# Patient Record
Sex: Female | Born: 2000 | Race: Black or African American | Hispanic: No | Marital: Single | Smoking: Never smoker
Health system: Southern US, Community
[De-identification: ages and names within clinical notes are randomized; demographics above are authoritative.]

## PROBLEM LIST (undated history)

## (undated) ENCOUNTER — Ambulatory Visit: Admission: EM | Payer: 59

## (undated) DIAGNOSIS — R7303 Prediabetes: Secondary | ICD-10-CM

## (undated) DIAGNOSIS — K429 Umbilical hernia without obstruction or gangrene: Secondary | ICD-10-CM

## (undated) DIAGNOSIS — E282 Polycystic ovarian syndrome: Secondary | ICD-10-CM

## (undated) HISTORY — PX: NO PAST SURGERIES: SHX2092

---
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
## 2021-01-21 ENCOUNTER — Other Ambulatory Visit: Payer: Self-pay

## 2021-01-21 MED ORDER — NORETHIN ACE-ETH ESTRAD-FE 1.5-30 MG-MCG PO TABS
ORAL_TABLET | ORAL | 1 refills | Status: DC
  Filled 2021-01-21 – 2021-02-07 (×2): qty 28, 28d supply, fill #0
  Filled 2021-03-01: qty 28, 28d supply, fill #1

## 2021-02-04 ENCOUNTER — Encounter: Payer: Self-pay | Admitting: Obstetrics and Gynecology

## 2021-02-04 ENCOUNTER — Other Ambulatory Visit: Payer: Self-pay

## 2021-02-04 ENCOUNTER — Ambulatory Visit (INDEPENDENT_AMBULATORY_CARE_PROVIDER_SITE_OTHER): Payer: No Typology Code available for payment source | Admitting: Obstetrics and Gynecology

## 2021-02-04 VITALS — BP 120/72 | Ht 65.0 in | Wt 271.6 lb

## 2021-02-04 DIAGNOSIS — N926 Irregular menstruation, unspecified: Secondary | ICD-10-CM

## 2021-02-04 DIAGNOSIS — N921 Excessive and frequent menstruation with irregular cycle: Secondary | ICD-10-CM

## 2021-02-04 NOTE — Progress Notes (Signed)
Patient ID: Brianna Gonzalez, female   DOB: [birth year], 20 y.o.   MRN: [id number]  Reason for Consult: Gynecologic Exam   Referred by Kaylyn Lim, NP  Subjective:     HPI:  Brianna Gonzalez is a 20 y.o. female  Gynecological History  No LMP recorded. Patient has had an implant.  Past Medical History:  Diagnosis Date   Pre-diabetes    Family History  Problem Relation Age of Onset   Healthy Mother    No past surgical history on file.  Short Social History:  Social History   Tobacco Use   Smoking status: Never   Smokeless tobacco: Never  Substance Use Topics   Alcohol use: Not Currently    No Known Allergies  Current Outpatient Medications  Medication Sig Dispense Refill   Amphetamine-Dextroamphetamine (ADDERALL PO) Take by mouth. (Patient not taking: Reported on 02/04/2021)     EPINEPHrine 0.3 mg/0.3 mL IJ SOAJ injection INJECT 0.3 MG INTO THE MUSCLE ONCE AS DIRECTED (Patient not taking: Reported on 02/04/2021) 2 each 2   famotidine (PEPCID) 20 MG tablet Take 1 tablet (20 mg total) by mouth 2 (two) times daily. (Patient not taking: Reported on 02/04/2021) 8 tablet 0   famotidine (PEPCID) 20 MG tablet TAKE 1 TABLET BY MOUTH TWICE DAILY (Patient not taking: Reported on 02/04/2021) 8 tablet 0   mupirocin ointment (BACTROBAN) 2 % Apply 1 application topically 2 (two) times daily. Apply under the nail for 7 days to prevent infection (Patient not taking: Reported on 02/04/2021) 22 g 0   norethindrone-ethinyl estradiol-iron (LOESTRIN FE) 1.5-30 MG-MCG tablet Take 1 tablet by mouth once a day for 28 days (Patient not taking: Reported on 02/04/2021) 28 tablet 1   predniSONE (DELTASONE) 20 MG tablet 3 tablets daily x 4 days (Patient not taking: Reported on 02/04/2021) 12 tablet 0   predniSONE (DELTASONE) 20 MG tablet TAKE 3 TABLETS (60MG ) BY MOUTH DAILY FOR 4 DAYS (Patient not taking: Reported on 02/04/2021) 12 tablet 0   Sertraline HCl (ZOLOFT PO) Take by mouth. (Patient not  taking: Reported on 02/04/2021)     No current facility-administered medications for this visit.    Review of Systems  Constitutional: Negative for chills, fatigue, fever and unexpected weight change.  HENT: Negative for trouble swallowing.  Eyes: Negative for loss of vision.  Respiratory: Negative for cough, shortness of breath and wheezing.  Cardiovascular: Negative for chest pain, leg swelling, palpitations and syncope.  GI: Negative for abdominal pain, blood in stool, diarrhea, nausea and vomiting.  GU: Negative for difficulty urinating, dysuria, frequency and hematuria.  Musculoskeletal: Negative for back pain, leg pain and joint pain.  Skin: Negative for rash.  Neurological: Negative for dizziness, headaches, light-headedness, numbness and seizures.  Psychiatric: Negative for behavioral problem, confusion, depressed mood and sleep disturbance.       Objective:  Objective   Vitals:   02/04/21 1557  BP: 120/72  Weight: 271 lb 9.6 oz (123.2 kg)  Height: 5\' 5"  (1.651 m)   Body mass index is 45.2 kg/m.  Physical Exam Vitals and nursing note reviewed. Exam conducted with a chaperone present.  Constitutional:      Appearance: Normal appearance.  HENT:     Head: Normocephalic and atraumatic.  Eyes:     Extraocular Movements: Extraocular movements intact.     Pupils: Pupils are equal, round, and reactive to light.  Cardiovascular:     Rate and Rhythm: Normal rate and regular rhythm.  Pulmonary:  Effort: Pulmonary effort is normal.     Breath sounds: Normal breath sounds.  Abdominal:     General: Abdomen is flat.     Palpations: Abdomen is soft.  Musculoskeletal:     Cervical back: Normal range of motion.  Skin:    General: Skin is warm and dry.  Neurological:     General: No focal deficit present.     Mental Status: She is alert and oriented to person, place, and time.  Psychiatric:        Behavior: Behavior normal.        Thought Content: Thought content  normal.        Judgment: Judgment normal.    Assessment/Plan:    20 yo with menorrhagia with irregular menstrual cycle Labs for PCOS.  Labs to check vitamin levels  More than 10 minutes were spent face to face with the patient in the room, reviewing the medical record, labs and images, and coordinating care for the patient. The plan of management was discussed in detail and counseling was provided.       Adrian Prows MD [facility] 02/04/2021 4:28 PM

## 2021-02-04 NOTE — Patient Instructions (Signed)
Iron-Rich Diet  Iron is a mineral that helps your body produce hemoglobin. Hemoglobin is a protein in red blood cells that carries oxygen to your body's tissues. Eating too little iron may cause you to feel weak and tired, and it can increase your risk of infection. Iron is naturally found in many foods, and many foods have iron added to them (are iron-fortified). You may need to follow an iron-rich diet if you do not have enough iron in your body due to certain medical conditions. The amount of iron that you need each day depends on your age, your sex, and any medical conditions you have. Follow instructions from your health care provider or a dietitian about how much ironyou should eat each day. What are tips for following this plan? Reading food labels Check food labels to see how many milligrams (mg) of iron are in each serving. Cooking Cook foods in pots and pans that are made from iron. Take these steps to make it easier for your body to absorb iron from certain foods: Soak beans overnight before cooking. Soak whole grains overnight and drain them before using. Ferment flours before baking, such as by using yeast in bread dough. Meal planning When you eat foods that contain iron, you should eat them with foods that are high in vitamin C. These include oranges, peppers, tomatoes, potatoes, and mangoes. Vitamin C helps your body absorb iron. Certain foods and drinks prevent your body from absorbing iron properly. Avoid eating these foods in the same meal as iron-rich foods or with iron supplements. These foods include: Coffee, black tea, and red wine. Milk, dairy products, and foods that are high in calcium. Beans and soybeans. Whole grains. General information Take iron supplements only as told by your health care provider. An overdose of iron can be life-threatening. If you were prescribed iron supplements, take them with orange juice or a vitamin C supplement. When you eat iron-fortified  foods or take an iron supplement, you should also eat foods that naturally contain iron, such as meat, poultry, and fish. Eating naturally iron-rich foods helps your body absorb the iron that is added to other foods or contained in a supplement. Iron from animal sources is better absorbed than iron from plant sources. What foods should I eat? Fruits Prunes. Raisins. Eat fruits high in vitamin C, such as oranges, grapefruits, and strawberries,with iron-rich foods. Vegetables Spinach (cooked). Green peas. Broccoli. Fermented vegetables. Eat vegetables high in vitamin C, such as leafy greens, potatoes, bell peppers,and tomatoes, with iron-rich foods. Grains Iron-fortified breakfast cereal. Iron-fortified whole-wheat bread. Enrichedrice. Sprouted grains. Meats and other proteins Beef liver. Beef. [place]. Chicken. Oysters. Shrimp. [place]. Sardines. Chickpeas.Nuts. Tofu. Pumpkin seeds. Beverages Tomato juice. Fresh orange juice. Prune juice. Hibiscus tea. Iron-fortifiedinstant breakfast shakes. Sweets and desserts Blackstrap molasses. Seasonings and condiments Tahini. Fermented soy sauce. Other foods Wheat germ. The items listed above may not be a complete list of recommended foods and beverages. Contact a dietitian for more information. What foods should I limit? These are foods that should be limited while eating iron-rich foods as they canreduce the absorption of iron in your body. Grains Whole grains. Bran cereal. Bran flour. Meats and other proteins Soybeans. Products made from soy protein. Black beans. Lentils. Mung beans.Split peas. Dairy Milk. Cream. Cheese. Yogurt. Cottage cheese. Beverages Coffee. Black tea. Red wine. Sweets and desserts Cocoa. Chocolate. Ice cream. Seasonings and condiments Basil. Oregano. Large amounts of parsley. The items listed above may not be a complete list of  foods and beverages you should limit. Contact a dietitian for more information. Summary Iron  is a mineral that helps your body produce hemoglobin. Hemoglobin is a protein in red blood cells that carries oxygen to your body's tissues. Iron is naturally found in many foods, and many foods have iron added to them (are iron-fortified). When you eat foods that contain iron, you should eat them with foods that are high in vitamin C. Vitamin C helps your body absorb iron. Certain foods and drinks prevent your body from absorbing iron properly, such as whole grains and dairy products. You should avoid eating these foods in the same meal as iron-rich foods or with iron supplements. This information is not intended to replace advice given to you by your health care provider. Make sure you discuss any questions you have with your healthcare provider. Document Revised: 07/16/2020 Document Reviewed: 07/16/2020 Elsevier Patient Education  2022 [employer].

## 2021-02-05 ENCOUNTER — Other Ambulatory Visit: Payer: No Typology Code available for payment source

## 2021-02-05 DIAGNOSIS — N921 Excessive and frequent menstruation with irregular cycle: Secondary | ICD-10-CM

## 2021-02-05 DIAGNOSIS — N926 Irregular menstruation, unspecified: Secondary | ICD-10-CM

## 2021-02-07 ENCOUNTER — Other Ambulatory Visit: Payer: Self-pay

## 2021-02-10 LAB — CBC WITH DIFFERENTIAL
Basophils Absolute: 0 10*3/uL (ref 0.0–0.2)
Basos: 1 %
EOS (ABSOLUTE): 0.2 10*3/uL (ref 0.0–0.4)
Eos: 5 %
Hematocrit: 36.1 % (ref 34.0–46.6)
Hemoglobin: 11.5 g/dL (ref 11.1–15.9)
Immature Grans (Abs): 0 10*3/uL (ref 0.0–0.1)
Immature Granulocytes: 0 %
Lymphocytes Absolute: 1.8 10*3/uL (ref 0.7–3.1)
Lymphs: 37 %
MCH: 26.4 pg — ABNORMAL LOW (ref 26.6–33.0)
MCHC: 31.9 g/dL (ref 31.5–35.7)
MCV: 83 fL (ref 79–97)
Monocytes Absolute: 0.3 10*3/uL (ref 0.1–0.9)
Monocytes: 6 %
Neutrophils Absolute: 2.5 10*3/uL (ref 1.4–7.0)
Neutrophils: 51 %
RBC: 4.35 x10E6/uL (ref 3.77–5.28)
RDW: 13.3 % (ref 11.7–15.4)
WBC: 4.9 10*3/uL (ref 3.4–10.8)

## 2021-02-10 LAB — IRON AND TIBC
Iron Saturation: 9 % — CL (ref 15–55)
Iron: 27 ug/dL (ref 27–159)
Total Iron Binding Capacity: 303 ug/dL (ref 250–450)
UIBC: 276 ug/dL (ref 131–425)

## 2021-02-10 LAB — FERRITIN: Ferritin: 52 ng/mL (ref 15–77)

## 2021-02-10 LAB — TSH+PRL+FSH+TESTT+LH+DHEA S...
17-Hydroxyprogesterone: 19 ng/dL
Androstenedione: 80 ng/dL (ref 41–262)
DHEA-SO4: 132 ug/dL (ref 110.0–433.2)
FSH: 6.8 m[IU]/mL
LH: 8.3 m[IU]/mL
Prolactin: 12.8 ng/mL (ref 4.8–23.3)
TSH: 1.01 u[IU]/mL (ref 0.450–4.500)
Testosterone, Free: 1.3 pg/mL
Testosterone: 14 ng/dL (ref 13–71)

## 2021-02-10 LAB — HEMOGLOBIN A1C
Est. average glucose Bld gHb Est-mCnc: 117 mg/dL
Hgb A1c MFr Bld: 5.7 % — ABNORMAL HIGH (ref 4.8–5.6)

## 2021-02-10 LAB — VITAMIN B12: Vitamin B-12: 192 pg/mL — ABNORMAL LOW (ref 232–1245)

## 2021-02-19 ENCOUNTER — Other Ambulatory Visit: Payer: Self-pay

## 2021-02-19 MED ORDER — CETIRIZINE HCL 10 MG PO TABS: ORAL_TABLET | ORAL | 10 refills | Status: DC

## 2021-03-01 ENCOUNTER — Other Ambulatory Visit: Payer: Self-pay

## 2021-04-01 ENCOUNTER — Ambulatory Visit (INDEPENDENT_AMBULATORY_CARE_PROVIDER_SITE_OTHER): Payer: No Typology Code available for payment source | Admitting: Obstetrics and Gynecology

## 2021-04-01 ENCOUNTER — Other Ambulatory Visit: Payer: Self-pay

## 2021-04-01 ENCOUNTER — Encounter: Payer: Self-pay | Admitting: Obstetrics and Gynecology

## 2021-04-01 VITALS — BP 118/68 | Ht 65.0 in | Wt 269.4 lb

## 2021-04-01 DIAGNOSIS — E538 Deficiency of other specified B group vitamins: Secondary | ICD-10-CM

## 2021-04-01 DIAGNOSIS — N921 Excessive and frequent menstruation with irregular cycle: Secondary | ICD-10-CM | POA: Diagnosis not present

## 2021-04-01 MED ORDER — VITAMIN B-12 1000 MCG PO TABS: 1000.0000 ug | ORAL_TABLET | Freq: Every day | ORAL | 11 refills | Status: DC

## 2021-04-01 MED ORDER — NORETHIN ACE-ETH ESTRAD-FE 1.5-30 MG-MCG PO TABS: ORAL_TABLET | ORAL | 11 refills | Status: DC

## 2021-04-01 NOTE — Patient Instructions (Addendum)
B12- 1000 to 2000 mcg daily   Prediabetes Prediabetes is when your blood sugar (blood glucose) level is higher than normal but not high enough for you to be diagnosed with type 2 diabetes. Having prediabetes puts you at risk for developing type 2 diabetes (type 2 diabetes mellitus). With certain lifestyle changes, you may be able to prevent or delay the onset of type 2 diabetes. This is important because type 2 diabetes can lead to serious complications, such as: Heart disease. Stroke. Blindness. Kidney disease. Depression. Poor circulation in the feet and legs. In severe cases, this could lead to surgical removal of a leg (amputation). What are the causes? The exact cause of prediabetes is not known. It may result from insulin resistance. Insulin resistance develops when cells in the body do not respond properly to insulin that the body makes. This can cause excess glucose to build up in the blood. High blood glucose (hyperglycemia) can develop. What increases the risk? The following factors may make you more likely to develop this condition: You have a family member with type 2 diabetes. You are older than 45 years. You had a temporary form of diabetes during a pregnancy (gestational diabetes). You had polycystic ovary syndrome (PCOS). You are overweight or obese. You are inactive (sedentary). You have a history of heart disease, including problems with cholesterol levels, high levels of blood fats, or high blood pressure. What are the signs or symptoms? You may have no symptoms. If you do have symptoms, they may include: Increased hunger. Increased thirst. Increased urination. Vision changes, such as blurry vision. Tiredness (fatigue). How is this diagnosed? This condition can be diagnosed with blood tests. Your blood glucose may be checked with one or more of the following tests: A fasting blood glucose (FBG) test. You will not be allowed to eat (you will fast) for at least 8 hours  before a blood sample is taken. An A1C blood test (hemoglobin A1C). This test provides information about blood glucose levels over the previous 2?3 months. An oral glucose tolerance test (OGTT). This test measures your blood glucose at two points in time: After fasting. This is your baseline level. Two hours after you drink a beverage that contains glucose. You may be diagnosed with prediabetes if: Your FBG is 100?125 mg/dL (5.6-6.9 mmol/L). Your A1C level is 5.7?6.4% (39-46 mmol/mol). Your OGTT result is 140?199 mg/dL (7.8-11 mmol/L). These blood tests may be repeated to confirm your diagnosis. How is this treated? Treatment may include dietary and lifestyle changes to help lower your blood glucose and prevent type 2 diabetes from developing. In some cases, medicinemay be prescribed to help lower the risk of type 2 diabetes. Follow these instructions at home: Nutrition  Follow a healthy meal plan. This includes eating lean proteins, whole grains, legumes, fresh fruits and vegetables, low-fat dairy products, and healthy fats. Follow instructions from your health care provider about eating or drinking restrictions. Meet with a dietitian to create a healthy eating plan that is right for you.  Lifestyle Do moderate-intensity exercise for at least 30 minutes a day on 5 or more days each week, or as told by your health care provider. A mix of activities may be best, such as: Brisk walking, swimming, biking, and weight lifting. Lose weight as told by your health care provider. Losing 5-7% of your body weight can reverse insulin resistance. Do not drink alcohol if: Your health care provider tells you not to drink. You are pregnant, may be pregnant, or are  planning to become pregnant. If you drink alcohol: Limit how much you use to: 0-1 drink a day for women. 0-2 drinks a day for men. Be aware of how much alcohol is in your drink. In the [place] one drink equals one 12 oz bottle of beer (355 mL),  one 5 oz glass of wine (148 mL), or one 1 oz glass of hard liquor (44 mL). General instructions Take over-the-counter and prescription medicines only as told by your health care provider. You may be prescribed medicines that help lower the risk of type 2 diabetes. Do not use any products that contain nicotine or tobacco, such as cigarettes, e-cigarettes, and chewing tobacco. If you need help quitting, ask your health care provider. Keep all follow-up visits. This is important. Where to find more information American Diabetes Association: www.diabetes.org Academy of Nutrition and Dietetics: www.eatright.org American Heart Association: www.heart.org Contact a health care provider if: You have any of these symptoms: Increased hunger. Increased urination. Increased thirst. Fatigue. Vision changes, such as blurry vision. Get help right away if you: Have shortness of breath. Feel confused. Vomit or feel like you may vomit. Summary Prediabetes is when your blood sugar (blood glucose)level is higher than normal but not high enough for you to be diagnosed with type 2 diabetes. Having prediabetes puts you at risk for developing type 2 diabetes (type 2 diabetes mellitus). Make lifestyle changes such as eating a healthy diet and exercising regularly to help prevent diabetes. Lose weight as told by your health care provider. This information is not intended to replace advice given to you by your health care provider. Make sure you discuss any questions you have with your healthcare provider. Document Revised: 11/03/2019 Document Reviewed: 11/03/2019 Elsevier Patient Education  [place].    Vitamin B12 Deficiency Vitamin B12 deficiency occurs when the body does not have enough vitamin B12, which is an important vitamin. The body needs this vitamin: To make red blood cells. To make DNA. This is the genetic material inside cells. To help the nerves work properly so they can carry  messages from the brain to the body. Vitamin B12 deficiency can cause various health problems, such as a low red blood cell count (anemia) or nerve damage. What are the causes? This condition may be caused by: Not eating enough foods that contain vitamin B12. Not having enough stomach acid and digestive fluids to properly absorb vitamin B12 from the food that you eat. Certain digestive system diseases that make it hard to absorb vitamin B12. These diseases include Crohn's disease, chronic pancreatitis, and cystic fibrosis. A condition in which the body does not make enough of a protein (intrinsic factor), resulting in too few red blood cells (pernicious anemia). Having a surgery in which part of the stomach or small intestine is removed. Taking certain medicines that make it hard for the body to absorb vitamin B12. These medicines include: Heartburn medicines (antacids and proton pump inhibitors). Certain antibiotic medicines. Some medicines that are used to treat diabetes, tuberculosis, gout, or high cholesterol. What increases the risk? The following factors may make you more likely to develop a B12 deficiency: Being older than age 52. Eating a vegetarian or vegan diet, especially while you are pregnant. Eating a poor diet while you are pregnant. Taking certain medicines. Having alcoholism. What are the signs or symptoms? In some cases, there are no symptoms of this condition. If the condition leads to anemia or nerve damage, various symptoms can occur, such as: Weakness. Fatigue.  Loss of appetite. Weight loss. Numbness or tingling in your hands and feet. Redness and burning of the tongue. Confusion or memory problems. Depression. Sensory problems, such as color blindness, ringing in the ears, or loss of taste. Diarrhea or constipation. Trouble walking. If anemia is severe, symptoms can include: Shortness of breath. Dizziness. Rapid heart rate (tachycardia). How is this  diagnosed? This condition may be diagnosed with a blood test to measure the level of vitamin B12 in your blood. You may also have other tests, including: A group of tests that measure certain characteristics of blood cells (complete blood count, CBC). A blood test to measure intrinsic factor. A procedure where a thin tube with a camera on the end is used to look into your stomach or intestines (endoscopy). Other tests may be needed to discover the cause of B12 deficiency. How is this treated? Treatment for this condition depends on the cause. This condition may be treated by: Changing your eating and drinking habits, such as: Eating more foods that contain vitamin B12. Drinking less alcohol or no alcohol. Getting vitamin B12 injections. Taking vitamin B12 supplements. Your health care provider will tell you which dosage is best for you. Follow these instructions at home: Eating and drinking  Eat lots of healthy foods that contain vitamin B12, including: Meats and poultry. This includes beef, pork, chicken, [place], and organ meats, such as liver. Seafood. This includes clams, rainbow trout, salmon, tuna, and haddock. Eggs. Cereal and dairy products that are fortified. This means that vitamin B12 has been added to the food. Check the label on the package to see if the food is fortified. The items listed above may not be a complete list of recommended foods and beverages. Contact a dietitian for more information. General instructions Get any injections that are prescribed by your health care provider. Take supplements only as told by your health care provider. Follow the directions carefully. Do not drink alcohol if your health care provider tells you not to. In some cases, you may only be asked to limit alcohol use. Keep all follow-up visits as told by your health care provider. This is important. Contact a health care provider if: Your symptoms come back. Get help right away if  you: Develop shortness of breath. Have a rapid heart rate. Have chest pain. Become dizzy or lose consciousness. Summary Vitamin B12 deficiency occurs when the body does not have enough vitamin B12. The main causes of vitamin B12 deficiency include dietary deficiency, digestive diseases, pernicious anemia, and having a surgery in which part of the stomach or small intestine is removed. In some cases, there are no symptoms of this condition. If the condition leads to anemia or nerve damage, various symptoms can occur, such as weakness, shortness of breath, and numbness. Treatment may include getting vitamin B12 injections or taking vitamin B12 supplements. Eat lots of healthy foods that contain vitamin B12. This information is not intended to replace advice given to you by your health care provider. Make sure you discuss any questions you have with your healthcare provider. Document Revised: 01/21/2019 Document Reviewed: 04/13/2018 Elsevier Patient Education  2022 [employer].

## 2021-04-01 NOTE — Progress Notes (Signed)
Patient ID: Brianna Gonzalez, female   DOB: November 19, 2000, 20 y.o.   MRN: [id number]  Reason for Consult: Gynecologic Exam   Referred by No ref. provider found  Subjective:     HPI:  Brianna Gonzalez is a 20 y.o. female.  She is following up today regarding irregular bleeding with Nexplanon.  She reports that she took her birth control pill for several months and saw an improvement in her bleeding pattern.  She would like to continue taking this hormonal birth control pill.  She reports that she had a period when she discontinued the medication on 03/27/2021 and today is having a small light spotting.  She reports that her Nexplanon is due to be exchanged in 2023.  She is uncertain regarding the date of insertion of the Nexplanon.  She underwent laboratory testing.  No significant elevations in thyroid or prolactin levels were noted.  Elevated LH to [facility] ratio was noted.  She underwent laboratories screening for anemia.  B12 deficiency was noted.  Iron levels were normal.  Her laboratory testing showed that she was prediabetic.  She reports that she has not previously been told that she has prediabetes.  Gynecological History  Patient's last menstrual period was 03/27/2021.  Past Medical History:  Diagnosis Date   Pre-diabetes    Family History  Problem Relation Age of Onset   Healthy Mother    History reviewed. No pertinent surgical history.  Short Social History:  Social History   Tobacco Use   Smoking status: Never   Smokeless tobacco: Never  Substance Use Topics   Alcohol use: Not Currently    No Known Allergies  Current Outpatient Medications  Medication Sig Dispense Refill   Amphetamine-Dextroamphetamine (ADDERALL PO) Take by mouth. (Patient not taking: Reported on 02/04/2021)     cetirizine (ZYRTEC ALLERGY) 10 MG tablet take 1 pill a day 100 tablet 10   EPINEPHrine 0.3 mg/0.3 mL IJ SOAJ injection INJECT 0.3 MG INTO THE MUSCLE ONCE AS DIRECTED (Patient not  taking: Reported on 02/04/2021) 2 each 2   famotidine (PEPCID) 20 MG tablet Take 1 tablet (20 mg total) by mouth 2 (two) times daily. (Patient not taking: Reported on 02/04/2021) 8 tablet 0   famotidine (PEPCID) 20 MG tablet TAKE 1 TABLET BY MOUTH TWICE DAILY (Patient not taking: Reported on 02/04/2021) 8 tablet 0   mupirocin ointment (BACTROBAN) 2 % Apply 1 application topically 2 (two) times daily. Apply under the nail for 7 days to prevent infection (Patient not taking: Reported on 02/04/2021) 22 g 0   norethindrone-ethinyl estradiol-iron (LOESTRIN FE) 1.5-30 MG-MCG tablet Take 1 tablet by mouth once a day for 28 days 28 tablet 11   predniSONE (DELTASONE) 20 MG tablet 3 tablets daily x 4 days (Patient not taking: Reported on 02/04/2021) 12 tablet 0   predniSONE (DELTASONE) 20 MG tablet TAKE 3 TABLETS ('60MG'$ ) BY MOUTH DAILY FOR 4 DAYS (Patient not taking: Reported on 02/04/2021) 12 tablet 0   Sertraline HCl (ZOLOFT PO) Take by mouth. (Patient not taking: Reported on 02/04/2021)     No current facility-administered medications for this visit.    Review of Systems  Constitutional: Negative for chills, fatigue, fever and unexpected weight change.  HENT: Negative for trouble swallowing.  Eyes: Negative for loss of vision.  Respiratory: Negative for cough, shortness of breath and wheezing.  Cardiovascular: Negative for chest pain, leg swelling, palpitations and syncope.  GI: Negative for abdominal pain, blood in stool, diarrhea, nausea and vomiting.  GU: Negative for difficulty urinating, dysuria, frequency and hematuria.  Musculoskeletal: Negative for back pain, leg pain and joint pain.  Skin: Negative for rash.  Neurological: Negative for dizziness, headaches, light-headedness, numbness and seizures.  Psychiatric: Negative for behavioral problem, confusion, depressed mood and sleep disturbance.       Objective:  Objective   Vitals:   04/01/21 0955  BP: 118/68  Weight: 269 lb 6.4 oz (122.2 kg)   Height: '5\' 5"'$  (1.651 m)   Body mass index is 44.83 kg/m.  Physical Exam Vitals and nursing note reviewed. Exam conducted with a chaperone present.  Constitutional:      Appearance: Normal appearance.  HENT:     Head: Normocephalic and atraumatic.  Eyes:     Extraocular Movements: Extraocular movements intact.     Pupils: Pupils are equal, round, and reactive to light.  Cardiovascular:     Rate and Rhythm: Normal rate and regular rhythm.  Pulmonary:     Effort: Pulmonary effort is normal.     Breath sounds: Normal breath sounds.  Abdominal:     General: Abdomen is flat.     Palpations: Abdomen is soft.  Musculoskeletal:     Cervical back: Normal range of motion.  Skin:    General: Skin is warm and dry.  Neurological:     General: No focal deficit present.     Mental Status: She is alert and oriented to person, place, and time.  Psychiatric:        Behavior: Behavior normal.        Thought Content: Thought content normal.        Judgment: Judgment normal.    Assessment/Plan:     20 year old with menorrhagia and irregular menstrual cycle bleeding. She would like to continue the hormonal birth control pill for management of her irregular menstrual cycle.  Prescription was sent.  Discussed the possibility of PCOS and provide the patient with information regarding PCOS. B12 deficiency-recommended supplementation with B12 and sent prescription to the pharmacy for the patient.  Recommended repeating labs in 6 months to assess for improvement. Prediabetic.  Discussed implementation of lifestyle changes.  Discussed options for metformin therapy.  Provide the patient with information.  She would like to discuss the possibility of starting metformin with her aunt.  Will plan to follow-up with the patient in 6 months.  More than 20 minutes were spent face to face with the patient in the room, reviewing the medical record, labs and images, and coordinating care for the patient. The  plan of management was discussed in detail and counseling was provided.      Adrian Prows MD [facility] 04/01/2021 10:15 AM

## 2021-04-03 ENCOUNTER — Other Ambulatory Visit: Payer: Self-pay

## 2021-04-03 ENCOUNTER — Telehealth: Payer: Self-pay

## 2021-04-03 DIAGNOSIS — N921 Excessive and frequent menstruation with irregular cycle: Secondary | ICD-10-CM

## 2021-04-03 MED ORDER — NORETHIN ACE-ETH ESTRAD-FE 1.5-30 MG-MCG PO TABS
ORAL_TABLET | ORAL | 11 refills | Status: DC
  Filled 2021-04-03: qty 28, 28d supply, fill #0

## 2021-04-03 NOTE — Telephone Encounter (Signed)
Pt was seen on Mon and rx of bc was sent to [facility]; pt actually needs rx sent to [facility].  [phone]  Pt aware by [facility] pharm changed and rx sent to them.

## 2021-04-04 ENCOUNTER — Other Ambulatory Visit: Payer: Self-pay

## 2021-04-08 ENCOUNTER — Other Ambulatory Visit: Payer: Self-pay | Admitting: Obstetrics and Gynecology

## 2021-04-08 ENCOUNTER — Other Ambulatory Visit: Payer: Self-pay

## 2021-04-08 DIAGNOSIS — N921 Excessive and frequent menstruation with irregular cycle: Secondary | ICD-10-CM

## 2021-04-08 MED ORDER — NORETHIN ACE-ETH ESTRAD-FE 1.5-30 MG-MCG PO TABS
ORAL_TABLET | ORAL | 11 refills | Status: DC
  Filled 2021-04-08: qty 28, fill #0

## 2021-04-17 ENCOUNTER — Other Ambulatory Visit: Payer: Self-pay

## 2021-04-17 ENCOUNTER — Ambulatory Visit
Admission: EM | Admit: 2021-04-17 | Discharge: 2021-04-17 | Disposition: A | Discharge status: Home / Self Care | Payer: No Typology Code available for payment source | Attending: Sports Medicine | Admitting: Sports Medicine

## 2021-04-17 DIAGNOSIS — S8011XA Contusion of right lower leg, initial encounter: Secondary | ICD-10-CM
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
## 2021-05-13 ENCOUNTER — Other Ambulatory Visit: Payer: Self-pay

## 2021-08-12 IMAGING — CR DG TOE 5TH 2+V*R*
3 series · 3 of 3 positions shown · non-contrast
Comparison: None.

CLINICAL DATA: Right small toe contusion, crushing injury

EXAM:
RIGHT FIFTH TOE

[toe ap]
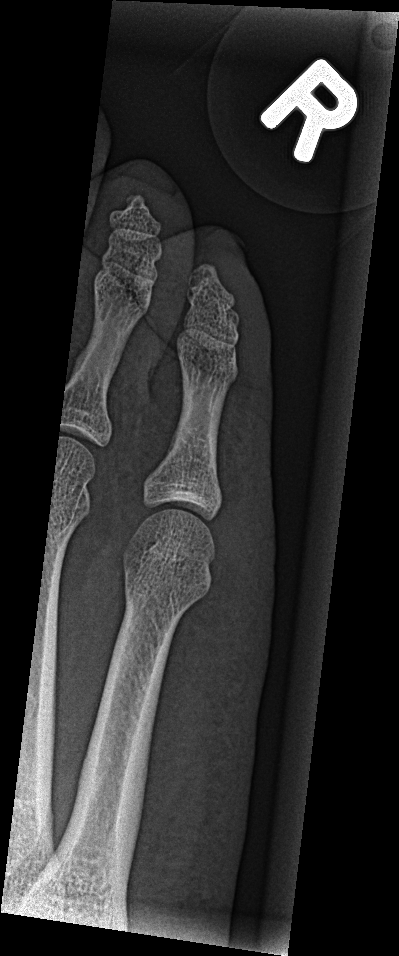

[toe obl]
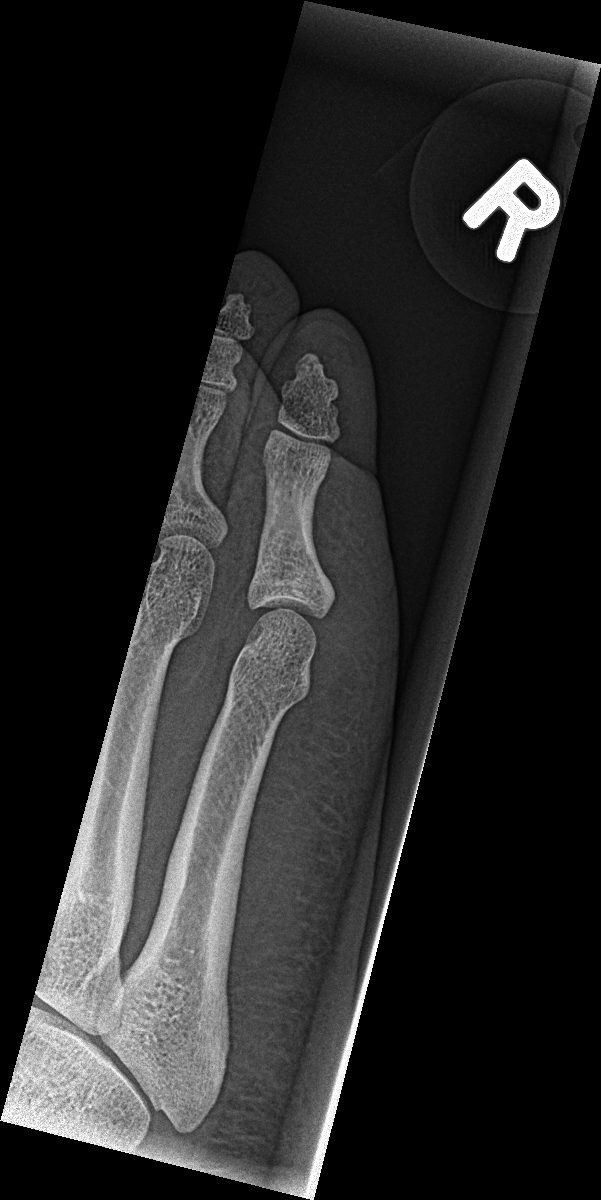

[toe lat]
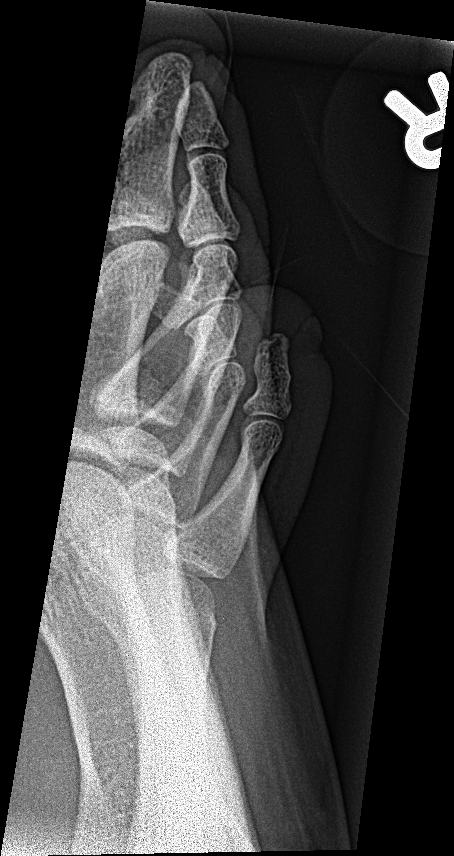

[3 of 3 positions shown; findings below may reference images not displayed]

FINDINGS: There is no evidence of fracture or dislocation. There is no
evidence of arthropathy or other focal bone abnormality. Soft tissue
edema.
IMPRESSION: No fracture or dislocation of the right fifth toe. Soft tissue
edema.

## 2021-10-02 ENCOUNTER — Ambulatory Visit: Payer: No Typology Code available for payment source | Admitting: Obstetrics and Gynecology

## 2021-10-02 NOTE — Progress Notes (Signed)
Patient, No Pcp Per (Inactive)   Chief Complaint  Patient presents with   Follow-up    From last visit with CRS, BC sent did help with bleeding. Stopped BC middle/end of Oct and bleeding is heavy and severe cramping is back    HPI:      [name] is a 21 y.o. No obstetric history on file. whose LMP was Patient's last menstrual period was 09/27/2021 (exact date)., presents today for heavy bleeding with nexplanon. Nexplanon placed ~04/2019. Was started on OCPs 8/22 with Dr. Gilman Schmidt for BTB/menorrhagia with nexplanon. Pt had monthly periods on pills, lasting 3-4 days, mod flow, no BTB, no dysmen. Pt stopped OCPs 10/22 and has been bleeding daily since. Flow is heavy with clots, changing large pads several times an hour, also with dysmen, somewhat improved with ibup/tylenol. Would like to go back on OCPs due to bleeding. Had normal thyroid/prolactin labs 6/22. Pt is not sex active; had neg STD testing 2 wks ago.   There are no problems to display for this patient.   Past Surgical History:  Procedure Laterality Date   NO PAST SURGERIES      Family History  Problem Relation Age of Onset   Healthy Mother     Social History   Socioeconomic History   Marital status: Single    Spouse name: Not on file   Number of children: Not on file   Years of education: Not on file   Highest education level: Not on file  Occupational History   Not on file  Tobacco Use   Smoking status: Never   Smokeless tobacco: Never  Vaping Use   Vaping Use: Never used  Substance and Sexual Activity   Alcohol use: Not Currently   Drug use: Not Currently   Sexual activity: Not Currently    Birth control/protection: None  Other Topics Concern   Not on file  Social History Narrative   ** Merged History Encounter **       Social Determinants of Health   Financial Resource Strain: Not on file  Food Insecurity: Not on file  Transportation Needs: Not on file  Physical Activity: Not on  file  Stress: Not on file  Social Connections: Not on file  Intimate Partner Violence: Not on file    Outpatient Medications Prior to Visit  Medication Sig Dispense Refill   EPINEPHrine 0.3 mg/0.3 mL IJ SOAJ injection INJECT 0.3 MG INTO THE MUSCLE ONCE AS DIRECTED 2 each 2   Amphetamine-Dextroamphetamine (ADDERALL PO) Take by mouth. (Patient not taking: No sig reported)     cetirizine (ZYRTEC ALLERGY) 10 MG tablet take 1 pill a day 100 tablet 10   famotidine (PEPCID) 20 MG tablet Take 1 tablet (20 mg total) by mouth 2 (two) times daily. (Patient not taking: No sig reported) 8 tablet 0   famotidine (PEPCID) 20 MG tablet TAKE 1 TABLET BY MOUTH TWICE DAILY (Patient not taking: No sig reported) 8 tablet 0   mupirocin ointment (BACTROBAN) 2 % Apply 1 application topically 2 (two) times daily. Apply under the nail for 7 days to prevent infection (Patient not taking: No sig reported) 22 g 0   norethindrone-ethinyl estradiol-iron (LOESTRIN FE) 1.5-30 MG-MCG tablet Take 1 tablet by mouth once a day for 28 days (Patient not taking: Reported on 10/03/2021) 28 tablet 11   predniSONE (DELTASONE) 20 MG tablet 3 tablets daily x 4 days (Patient not taking: No sig reported) 12 tablet 0   predniSONE (  DELTASONE) 20 MG tablet TAKE 3 TABLETS (60MG ) BY MOUTH DAILY FOR 4 DAYS (Patient not taking: No sig reported) 12 tablet 0   Sertraline HCl (ZOLOFT PO) Take by mouth. (Patient not taking: No sig reported)     vitamin B-12 (CYANOCOBALAMIN) 1000 MCG tablet Take 1 tablet (1,000 mcg total) by mouth daily. 30 tablet 11   No facility-administered medications prior to visit.      ROS:  Review of Systems  Constitutional:  Negative for fever.  Gastrointestinal:  Negative for blood in stool, constipation, diarrhea, nausea and vomiting.  Genitourinary:  Positive for menstrual problem. Negative for dyspareunia, dysuria, flank pain, frequency, hematuria, urgency, vaginal bleeding, vaginal discharge and vaginal pain.   Musculoskeletal:  Negative for back pain.  Skin:  Negative for rash.  BREAST: No symptoms   OBJECTIVE:   Vitals:  BP 108/70    Ht 5\' 3"  (1.6 m)    Wt 273 lb (123.8 kg)    LMP 09/27/2021 (Exact Date)    BMI 48.36 kg/m   Physical Exam Vitals reviewed.  Constitutional:      Appearance: She is well-developed.  Pulmonary:     Effort: Pulmonary effort is normal.  Musculoskeletal:        General: Normal range of motion.     Cervical back: Normal range of motion.  Skin:    General: Skin is warm and dry.  Neurological:     General: No focal deficit present.     Mental Status: She is alert and oriented to person, place, and time.     Cranial Nerves: No cranial nerve deficit.  Psychiatric:        Mood and Affect: Mood normal.        Behavior: Behavior normal.        Thought Content: Thought content normal.        Judgment: Judgment normal.    Assessment/Plan: Breakthrough bleeding on Nexplanon - Plan: norethindrone-ethinyl estradiol-iron (LOESTRIN FE) 1.5-30 MG-MCG tablet; restart OCPs today. Rx RF eRxd till 9/23. Discussed removal of nexplanon 9/23 or earlier. Pt would like to RTO sooner for removal and will just continue OCPs. Not sex active, neg STD testing 2 wks ago. F/u for further eval if sx don't resolve with OCPs.  Menorrhagia with irregular cycle - Plan: norethindrone-ethinyl estradiol-iron (LOESTRIN FE) 1.5-30 MG-MCG tablet    Meds ordered this encounter  Medications   norethindrone-ethinyl estradiol-iron (LOESTRIN FE) 1.5-30 MG-MCG tablet    Sig: Take 1 tablet by mouth once a day for 28 days    Dispense:  84 tablet    Refill:  2    Order Specific Question:   Supervising Provider    Answer:   Gae Dry [[id number]]      Return if symptoms worsen or fail to improve.  Georgie Haque B. Jude Linck, PA-C 10/03/2021 11:21 AM

## 2021-10-03 ENCOUNTER — Encounter: Payer: Self-pay | Admitting: Obstetrics and Gynecology

## 2021-10-03 ENCOUNTER — Other Ambulatory Visit: Payer: Self-pay

## 2021-10-03 ENCOUNTER — Ambulatory Visit (INDEPENDENT_AMBULATORY_CARE_PROVIDER_SITE_OTHER): Payer: No Typology Code available for payment source | Admitting: Obstetrics and Gynecology

## 2021-10-03 VITALS — BP 108/70 | Ht 63.0 in | Wt 273.0 lb

## 2021-10-03 DIAGNOSIS — Z975 Presence of (intrauterine) contraceptive device: Secondary | ICD-10-CM

## 2021-10-03 DIAGNOSIS — N921 Excessive and frequent menstruation with irregular cycle: Secondary | ICD-10-CM

## 2021-10-03 MED ORDER — NORETHIN ACE-ETH ESTRAD-FE 1.5-30 MG-MCG PO TABS
ORAL_TABLET | ORAL | 2 refills | Status: DC
  Filled 2021-10-03 – 2021-10-18 (×2): qty 84, 84d supply, fill #0

## 2021-10-03 NOTE — Patient Instructions (Signed)
I value your feedback and you entrusting us with your care. If you get a [facility] patient survey, I would appreciate you taking the time to let us know about your experience today. Thank you! ? ? ?

## 2021-10-14 ENCOUNTER — Other Ambulatory Visit: Payer: Self-pay

## 2021-10-18 ENCOUNTER — Other Ambulatory Visit: Payer: Self-pay

## 2022-06-09 ENCOUNTER — Other Ambulatory Visit: Payer: Self-pay

## 2022-06-09 ENCOUNTER — Emergency Department
Admission: EM | Admit: 2022-06-09 | Discharge: 2022-06-10 | Disposition: A | Discharge status: Home / Self Care | Payer: No Typology Code available for payment source | Attending: Emergency Medicine | Admitting: Emergency Medicine

## 2022-06-09 ENCOUNTER — Encounter: Payer: Self-pay | Admitting: Emergency Medicine

## 2022-06-09 DIAGNOSIS — N8312 Corpus luteum cyst of left ovary: Secondary | ICD-10-CM | POA: Diagnosis not present

## 2022-06-09 DIAGNOSIS — R102 Pelvic and perineal pain: Secondary | ICD-10-CM | POA: Diagnosis present

## 2022-06-09 DIAGNOSIS — L720 Epidermal cyst: Secondary | ICD-10-CM | POA: Insufficient documentation

## 2022-06-09 DIAGNOSIS — R112 Nausea with vomiting, unspecified: Secondary | ICD-10-CM | POA: Diagnosis not present

## 2022-06-09 DIAGNOSIS — D369 Benign neoplasm, unspecified site: Secondary | ICD-10-CM

## 2022-06-09 DIAGNOSIS — N83202 Unspecified ovarian cyst, left side: Secondary | ICD-10-CM

## 2022-06-09 LAB — COMPREHENSIVE METABOLIC PANEL
ALT: 12 U/L (ref 0–44)
AST: 22 U/L (ref 15–41)
Albumin: 4 g/dL (ref 3.5–5.0)
Alkaline Phosphatase: 60 U/L (ref 38–126)
Anion gap: 7 (ref 5–15)
BUN: 8 mg/dL (ref 6–20)
CO2: 22 mmol/L (ref 22–32)
Calcium: 8.7 mg/dL — ABNORMAL LOW (ref 8.9–10.3)
Chloride: 105 mmol/L (ref 98–111)
Creatinine, Ser: 0.71 mg/dL (ref 0.44–1.00)
GFR, Estimated: >60 mL/min (ref >60)
Glucose, Bld: 97 mg/dL (ref 70–99)
Potassium: 3.5 mmol/L (ref 3.5–5.1)
Sodium: 134 mmol/L — ABNORMAL LOW (ref 135–145)
Total Bilirubin: 0.6 mg/dL (ref 0.3–1.2)
Total Protein: 7.7 g/dL (ref 6.5–8.1)

## 2022-06-09 LAB — LIPASE, BLOOD: Lipase: 25 U/L (ref 11–51)

## 2022-06-09 MED ORDER — ONDANSETRON HCL 4 MG/2ML IJ SOLN
4.0000 mg | Freq: Once | INTRAMUSCULAR | Status: AC
  Administered 2022-06-09: 4 mg via INTRAVENOUS
  Filled 2022-06-09: qty 2

## 2022-06-09 MED ORDER — KETOROLAC TROMETHAMINE 30 MG/ML IJ SOLN
15.0000 mg | Freq: Once | INTRAMUSCULAR | Status: AC
  Administered 2022-06-09: 15 mg via INTRAVENOUS
  Filled 2022-06-09: qty 1

## 2022-06-09 MED ORDER — SODIUM CHLORIDE 0.9 % IV BOLUS
1000.0000 mL | Freq: Once | INTRAVENOUS | Status: AC
  Administered 2022-06-09: 1000 mL via INTRAVENOUS

## 2022-06-09 NOTE — ED Triage Notes (Signed)
Patient ambulatory to triage with steady gait, without difficulty or distress noted; pt reports onset sharp abd pain while at work tonight with recent heavy menstrual bleeding

## 2022-06-09 NOTE — ED Provider Notes (Signed)
[facility] Provider Note    Event Date/Time   First MD Initiated Contact with Patient 06/09/22 2334     (approximate)   History   Abdominal Pain   HPI  Brianna Gonzalez is a 21 y.o. female who presents to the ED from work with a chief complaint of abdominal pain.  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  Currently on her menstrual cycle and states it is heavy and passing clots.  History of PCOS.  Recent STD testing which was negative.  Denies fever, chest pain, shortness of breath, dysuria or diarrhea.     Past Medical History   Past Medical History:  Diagnosis Date   Pre-diabetes      Active Problem List  There are no problems to display for this patient.    Past Surgical History   Past Surgical History:  Procedure Laterality Date   NO PAST SURGERIES       Home Medications   Prior to Admission medications   Medication Sig Start Date End Date Taking? Authorizing Provider  HYDROcodone-acetaminophen (NORCO) 5-325 MG tablet Take 1 tablet by mouth every 6 (six) hours as needed for moderate pain. 06/10/22  Yes Paulette Blanch, MD  ondansetron (ZOFRAN-ODT) 4 MG disintegrating tablet Take 1 tablet (4 mg total) by mouth every 8 (eight) hours as needed for nausea or vomiting. 06/10/22  Yes Paulette Blanch, MD  norethindrone-ethinyl estradiol-iron (LOESTRIN FE) 1.5-30 MG-MCG tablet Take 1 tablet by mouth once a day for 28 days 3/73/42   Copland, Deirdre Evener, PA-C     Allergies  Patient has no known allergies.   Family History   Family History  Problem Relation Age of Onset   Healthy Mother      Physical Exam  Triage Vital Signs: ED Triage Vitals  Enc Vitals Group     BP 06/09/22 2318 118/81     Pulse Rate 06/09/22 2318 (!) 126     Resp 06/09/22 2318 18     Temp 06/09/22 2318 98.4 F (36.9 C)     Temp Source 06/09/22 2318 Oral      SpO2 06/09/22 2318 99 %     Weight 06/09/22 2317 260 lb (117.9 kg)     Height 06/09/22 2317 '5\' 5"'$  (1.651 m)     Head Circumference --      Peak Flow --      Pain Score 06/09/22 2316 10     Pain Loc --      Pain Edu? --      Excl. in [place]? --     Updated Vital Signs: BP 102/61 (BP Location: Left Arm)   Pulse 88   Temp 98.4 F (36.9 C) (Oral)   Resp 15   Ht '5\' 5"'$  (1.651 m)   Wt 117.9 kg   LMP 06/09/2022 (Exact Date)   SpO2 100%   BMI 43.27 kg/m    General: Awake, mild distress.  CV:  Tachycardic.  Good peripheral perfusion.  Resp:  Normal effort.  CTA B. Abd:  Mild tenderness to left lower quadrant/adnexal region without rebound or guarding.  No distention.  Other:  No truncal vesicles.   ED Results / Procedures / Treatments  Labs (all labs ordered are listed, but only abnormal results are displayed) Labs Reviewed  CBC WITH DIFFERENTIAL/PLATELET - Abnormal; Notable for the following components:  Result Value   Hemoglobin 11.6 (*)    Neutro Abs 1.2 (*)    Lymphs Abs 4.8 (*)    All other components within normal limits  COMPREHENSIVE METABOLIC PANEL - Abnormal; Notable for the following components:   Sodium 134 (*)    Calcium 8.7 (*)    All other components within normal limits  URINALYSIS, ROUTINE W REFLEX MICROSCOPIC - Abnormal; Notable for the following components:   Color, Urine AMBER (*)    APPearance HAZY (*)    Hgb urine dipstick LARGE (*)    Ketones, ur 5 (*)    Protein, ur 100 (*)    RBC / HPF >50 (*)    Bacteria, UA FEW (*)    All other components within normal limits  LIPASE, BLOOD  PATHOLOGIST SMEAR REVIEW  POC URINE PREG, ED     EKG  None   RADIOLOGY I have independently visualized and interpreted patient's ultrasound as well as noted the radiology interpretation:  Pelvic ultrasound: Simple left ovarian cyst, 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 EXAM: TRANSABDOMINAL AND TRANSVAGINAL ULTRASOUND OF PELVIS DOPPLER ULTRASOUND OF OVARIES TECHNIQUE: Both transabdominal and transvaginal ultrasound examinations of the pelvis were performed. Transabdominal technique was performed for global imaging of the pelvis including uterus, ovaries, adnexal regions, and pelvic cul-de-sac. It was necessary to proceed with endovaginal exam following the transabdominal exam to visualize the bilateral ovaries. Color and duplex Doppler ultrasound was utilized to evaluate blood flow to the ovaries. COMPARISON:  None Available. FINDINGS: Uterus Measurements: 6.7 cm x 3.0 cm x 4.9 cm = volume: 52.5 mL. No fibroids or other mass visualized. Endometrium Thickness: 4.4 mm.  No focal abnormality visualized. Right ovary Measurements: 6.4 cm x 3.8 cm x 4.8 cm = volume: 61.1 mL. A 5.3 cm x 3.9 cm x 4.6 cm well-defined hyperechoic area is seen within the right ovary. No abnormal flow is noted within this region on color Doppler evaluation Left ovary Measurements: 4.6 cm x 3.1 cm x 4.0 cm = volume: 29.9 mL. A 3.2 cm x 2.7 cm x 3.0 cm anechoic structure is seen within the left ovary. No abnormal flow is seen within this region on color Doppler evaluation. Pulsed Doppler evaluation of both ovaries demonstrates normal low-resistance arterial and venous waveforms. Other findings A small amount of pelvic free fluid is seen. IMPRESSION: 1. Findings which may represent a large right ovarian dermoid. Correlation with nonemergent pelvic MRI is recommended. 2. Simple left ovarian cyst. No follow-up imaging is recommended. This recommendation follows the consensus statement: Management of Asymptomatic Ovarian and Other Adnexal Cysts Imaged at [place]: Society of Radiologists in [place]. Radiology 2010; [phone]. 3. Normal bilateral ovarian flow. Electronically Signed   By: Virgina Norfolk M.D.   On: 06/10/2022 02:18      PROCEDURES:  Critical Care performed: No  Procedures   MEDICATIONS ORDERED IN ED: Medications  sodium chloride 0.9 % bolus 1,000 mL (0 mLs Intravenous Stopped 06/10/22 0058)  ondansetron (ZOFRAN) injection 4 mg (4 mg Intravenous Given 06/09/22 2356)  ketorolac (TORADOL) 30 MG/ML injection 15 mg (15 mg Intravenous Given 06/09/22 2356)     IMPRESSION / MDM / ASSESSMENT AND PLAN / ED COURSE  I reviewed the triage vital signs and the nursing notes.  21 year old female presenting with left lower quadrant/adnexal pain. Differential diagnosis includes, but is not limited to, ovarian cyst, ovarian torsion, acute appendicitis, diverticulitis, urinary tract infection/pyelonephritis, endometriosis, bowel obstruction, colitis, renal colic, gastroenteritis, hernia, fibroids, endometriosis, pregnancy related pain including ectopic pregnancy, etc. I have personally reviewed patient's records and note she had a GYN visit for menorrhagia in February 2023.  She currently has an Implanon.  Patient's presentation is most consistent with acute presentation with potential threat to life or bodily function.  We will obtain basic lab work, pelvic ultrasound.  Administer IV fluids, Toradol for pain, Zofran for nausea.  Will reassess.  Clinical Course as of 06/10/22 0616  Tue Jun 10, 2022  0323 Updated patient on ultrasound demonstrating simple left ovarian cyst and probable right dermoid cyst.  Will discharge home with prescription for analgesia and GYN follow-up for outpatient MRI.  Strict return precautions given.  Patient verbalizes understanding agrees with plan of care. [JS]    Clinical Course User Index [JS] Paulette Blanch, MD     FINAL CLINICAL IMPRESSION(S) / ED DIAGNOSES   Final diagnoses:  Pelvic pain in female  Nausea and vomiting, unspecified vomiting type  Cyst of left ovary  Dermoid cyst     Rx / DC Orders   ED Discharge Orders          Ordered     HYDROcodone-acetaminophen (NORCO) 5-325 MG tablet  Every 6 hours PRN        06/10/22 0325    ondansetron (ZOFRAN-ODT) 4 MG disintegrating tablet  Every 8 hours PRN        06/10/22 0325             Note:  This document was prepared using Dragon voice recognition software and may include unintentional dictation errors.   Paulette Blanch, MD 06/10/22 [phone]

## 2022-06-10 ENCOUNTER — Emergency Department: Payer: No Typology Code available for payment source

## 2022-06-10 DIAGNOSIS — R102 Pelvic and perineal pain: Secondary | ICD-10-CM | POA: Diagnosis not present

## 2022-06-10 LAB — CBC WITH DIFFERENTIAL/PLATELET
Abs Immature Granulocytes: 0.01 10*3/uL (ref 0.00–0.07)
Basophils Absolute: 0.1 10*3/uL (ref 0.0–0.1)
Basophils Relative: 2 %
Eosinophils Absolute: 0.1 10*3/uL (ref 0.0–0.5)
Eosinophils Relative: 1 %
HCT: 37.5 % (ref 36.0–46.0)
Hemoglobin: 11.6 g/dL — ABNORMAL LOW (ref 12.0–15.0)
Immature Granulocytes: 0 %
Lymphocytes Relative: 72 %
Lymphs Abs: 4.8 10*3/uL — ABNORMAL HIGH (ref 0.7–4.0)
MCH: 27.2 pg (ref 26.0–34.0)
MCHC: 30.9 g/dL (ref 30.0–36.0)
MCV: 87.8 fL (ref 80.0–100.0)
Monocytes Absolute: 0.5 10*3/uL (ref 0.1–1.0)
Monocytes Relative: 7 %
Neutro Abs: 1.2 10*3/uL — ABNORMAL LOW (ref 1.7–7.7)
Neutrophils Relative %: 18 %
Platelets: 194 10*3/uL (ref 150–400)
RBC: 4.27 MIL/uL (ref 3.87–5.11)
RDW: 14.7 % (ref 11.5–15.5)
Smear Review: NORMAL
WBC: 6.6 10*3/uL (ref 4.0–10.5)
nRBC: 0 % (ref 0.0–0.2)

## 2022-06-10 LAB — URINALYSIS, ROUTINE W REFLEX MICROSCOPIC
Bilirubin Urine: NEGATIVE
Glucose, UA: NEGATIVE mg/dL
Ketones, ur: 5 mg/dL — AB
Leukocytes,Ua: NEGATIVE
Nitrite: NEGATIVE
Protein, ur: 100 mg/dL — AB
RBC / HPF: >50 RBC/hpf — ABNORMAL HIGH (ref 0–5)
Specific Gravity, Urine: 1.006 (ref 1.005–1.030)
pH: 6 (ref 5.0–8.0)

## 2022-06-10 LAB — POC URINE PREG, ED: Preg Test, Ur: NEGATIVE

## 2022-06-10 LAB — PATHOLOGIST SMEAR REVIEW

## 2022-06-10 MED ORDER — ONDANSETRON 4 MG PO TBDP: 4.0000 mg | ORAL_TABLET | Freq: Three times a day (TID) | ORAL | 0 refills | Status: DC | PRN

## 2022-06-10 MED ORDER — HYDROCODONE-ACETAMINOPHEN 5-325 MG PO TABS: 1.0000 | ORAL_TABLET | Freq: Four times a day (QID) | ORAL | 0 refills | Status: DC | PRN

## 2022-06-10 NOTE — ED Notes (Signed)
Taken to US.

## 2022-06-10 NOTE — Discharge Instructions (Signed)
You may take medicines as needed for pain and nausea (Norco/Zofran #15).  Please follow-up with the gynecologist as you may need MRI to further assess the cyst on your right side.  Return to the ER for worsening symptoms, persistent vomiting, difficulty breathing or other concerns.

## 2022-09-14 ENCOUNTER — Emergency Department
Admission: EM | Admit: 2022-09-14 | Discharge: 2022-09-14 | Disposition: A | Discharge status: Home / Self Care | Payer: 59 | Attending: Emergency Medicine | Admitting: Emergency Medicine

## 2022-09-14 ENCOUNTER — Other Ambulatory Visit: Payer: Self-pay

## 2022-09-14 ENCOUNTER — Emergency Department: Payer: 59

## 2022-09-14 DIAGNOSIS — R1030 Lower abdominal pain, unspecified: Secondary | ICD-10-CM | POA: Diagnosis not present

## 2022-09-14 DIAGNOSIS — R102 Pelvic and perineal pain: Secondary | ICD-10-CM | POA: Insufficient documentation

## 2022-09-14 LAB — COMPREHENSIVE METABOLIC PANEL
ALT: 10 U/L (ref 0–44)
AST: 16 U/L (ref 15–41)
Albumin: 4.4 g/dL (ref 3.5–5.0)
Alkaline Phosphatase: 61 U/L (ref 38–126)
Anion gap: 8 (ref 5–15)
BUN: 11 mg/dL (ref 6–20)
CO2: 24 mmol/L (ref 22–32)
Calcium: 8.9 mg/dL (ref 8.9–10.3)
Chloride: 107 mmol/L (ref 98–111)
Creatinine, Ser: 0.69 mg/dL (ref 0.44–1.00)
GFR, Estimated: >60 mL/min (ref >60)
Glucose, Bld: 99 mg/dL (ref 70–99)
Potassium: 3.8 mmol/L (ref 3.5–5.1)
Sodium: 139 mmol/L (ref 135–145)
Total Bilirubin: 0.5 mg/dL (ref 0.3–1.2)
Total Protein: 8.3 g/dL — ABNORMAL HIGH (ref 6.5–8.1)

## 2022-09-14 LAB — URINALYSIS, ROUTINE W REFLEX MICROSCOPIC
Bilirubin Urine: NEGATIVE
Glucose, UA: NEGATIVE mg/dL
Hgb urine dipstick: NEGATIVE
Ketones, ur: 5 mg/dL — AB
Nitrite: NEGATIVE
Protein, ur: 100 mg/dL — AB
Specific Gravity, Urine: 1.027 (ref 1.005–1.030)
WBC, UA: >50 WBC/hpf (ref 0–5)
pH: 6 (ref 5.0–8.0)

## 2022-09-14 LAB — CBC
HCT: 38 % (ref 36.0–46.0)
Hemoglobin: 11.8 g/dL — ABNORMAL LOW (ref 12.0–15.0)
MCH: 28.2 pg (ref 26.0–34.0)
MCHC: 31.1 g/dL (ref 30.0–36.0)
MCV: 90.7 fL (ref 80.0–100.0)
Platelets: 270 10*3/uL (ref 150–400)
RBC: 4.19 MIL/uL (ref 3.87–5.11)
RDW: 13.3 % (ref 11.5–15.5)
WBC: 7.1 10*3/uL (ref 4.0–10.5)
nRBC: 0 % (ref 0.0–0.2)

## 2022-09-14 LAB — POC URINE PREG, ED: Preg Test, Ur: NEGATIVE

## 2022-09-14 LAB — LIPASE, BLOOD: Lipase: 26 U/L (ref 11–51)

## 2022-09-14 MED ORDER — SODIUM CHLORIDE 0.9 % IV SOLN
1.0000 g | Freq: Once | INTRAVENOUS | Status: AC
  Administered 2022-09-14: 1 g via INTRAVENOUS
  Filled 2022-09-14: qty 10

## 2022-09-14 MED ORDER — ONDANSETRON 4 MG PO TBDP
4.0000 mg | ORAL_TABLET | Freq: Once | ORAL | Status: AC
  Administered 2022-09-14: 4 mg via ORAL
  Filled 2022-09-14: qty 1

## 2022-09-14 MED ORDER — CEPHALEXIN 500 MG PO CAPS
500.0000 mg | ORAL_CAPSULE | Freq: Four times a day (QID) | ORAL | 0 refills | Status: AC
  Filled 2022-09-14 – 2022-09-15 (×2): qty 28, 7d supply, fill #0

## 2022-09-14 MED ORDER — OXYCODONE-ACETAMINOPHEN 5-325 MG PO TABS
1.0000 | ORAL_TABLET | Freq: Once | ORAL | Status: AC
  Administered 2022-09-14: 1 via ORAL
  Filled 2022-09-14: qty 1

## 2022-09-14 NOTE — ED Provider Notes (Signed)
[facility] Provider Note  Patient Contact: 5:34 PM (approximate)   History   Abdominal Pain and Vaginal Bleeding   HPI  Brianna Gonzalez is a 22 y.o. female with a history of ovarian cyst, presents to the emergency department with suprapubic abdominal discomfort multiple episodes of vomiting.  Patient states that she was offered surgical management for ovarian cyst in the past but declined.  She denies fever or chills at home.  She denies dysuria or increased urinary frequency.  She reports that she had a menstrual cycle that seemed normal for 2 days and then stopped      Physical Exam   Triage Vital Signs: ED Triage Vitals  Enc Vitals Group     BP 09/14/22 1517 127/79     Pulse Rate 09/14/22 1517 68     Resp 09/14/22 1517 18     Temp 09/14/22 1517 98.1 F (36.7 C)     Temp src --      SpO2 09/14/22 1517 95 %     Weight --      Height --      Head Circumference --      Peak Flow --      Pain Score 09/14/22 1516 10     Pain Loc --      Pain Edu? --      Excl. in [place]? --     Most recent vital signs: Vitals:   09/14/22 1517 09/14/22 1944  BP: 127/79 100/60  Pulse: 68 66  Resp: 18 17  Temp: 98.1 F (36.7 C) 97.7 F (36.5 C)  SpO2: 95% 98%     General: Alert and in no acute distress. Eyes:  PERRL. EOMI. Head: No acute traumatic findings ENT:      Nose: No congestion/rhinnorhea.      Mouth/Throat: Mucous membranes are moist. Neck: No stridor. No cervical spine tenderness to palpation. Cardiovascular:  Good peripheral perfusion Respiratory: Normal respiratory effort without tachypnea or retractions. Lungs CTAB. Good air entry to the bases with no decreased or absent breath sounds. Gastrointestinal: Bowel sounds 4 quadrants. Soft and tender to palpation. No guarding or rigidity. No palpable masses. No distention. No CVA tenderness. Musculoskeletal: Full range of motion to all extremities.  Neurologic:  No gross focal neurologic  deficits are appreciated.  Skin:   No rash noted Other:   ED Results / Procedures / Treatments   Labs (all labs ordered are listed, but only abnormal results are displayed) Labs Reviewed  COMPREHENSIVE METABOLIC PANEL - Abnormal; Notable for the following components:      Result Value   Total Protein 8.3 (*)    All other components within normal limits  CBC - Abnormal; Notable for the following components:   Hemoglobin 11.8 (*)    All other components within normal limits  URINALYSIS, ROUTINE W REFLEX MICROSCOPIC - Abnormal; Notable for the following components:   Color, Urine YELLOW (*)    APPearance HAZY (*)    Ketones, ur 5 (*)    Protein, ur 100 (*)    Leukocytes,Ua LARGE (*)    Bacteria, UA RARE (*)    All other components within normal limits  LIPASE, BLOOD  POC URINE PREG, ED      RADIOLOGY  I personally viewed and evaluated these images as part of my medical decision making, as well as reviewing the written report by the radiologist.  ED Provider Interpretation: 5.3 cm right ovarian lesion.  Consider dermoid tumor versus  endometrioma   PROCEDURES:  Critical Care performed: No  Procedures   MEDICATIONS ORDERED IN ED: Medications  cefTRIAXone (ROCEPHIN) 1 g in sodium chloride 0.9 % 100 mL IVPB (has no administration in time range)  oxyCODONE-acetaminophen (PERCOCET/ROXICET) 5-325 MG per tablet 1 tablet (1 tablet Oral Given 09/14/22 1751)  ondansetron (ZOFRAN-ODT) disintegrating tablet 4 mg (4 mg Oral Given 09/14/22 1751)     IMPRESSION / MDM / ASSESSMENT AND PLAN / ED COURSE  I reviewed the triage vital signs and the nursing notes.                              Assessment and plan:   Pelvic pain 22 year old female presents to the emergency department with suprapubic pain and multiple episodes of vomiting that occurred while patient was at work.  Vital signs are reassuring at triage.  On exam, patient was alert and nontoxic-appearing.  Patient did have  some suprapubic tenderness but no guarding.  CBC indicates normal white blood cell count.  CMP within range.  Urine pregnancy test negative.  Urinalysis indicates a large amount of leukocytes with rare bacteria concerning for UTI.  Dedicated pelvic ultrasound indicated a 5.3 right ovarian lesion.  Radiologist considered dermoid tumor versus endometrioma.  I reviewed patient's prior imaging and see that patient has had similar ultrasound findings on 06/10/2022.  Patient was seen by Dr. Jean Rosenthal and did not feel that her prior instances of pelvic pain were associated with likely dermoid tumor.  Dr. Jean Rosenthal had offered surgery in the past and patient declined.  Patient has a follow-up appointment with Dr. Jean Rosenthal in early February.  I recommended keeping that appointment.  In the emergency department, patient was given IV Rocephin and will be discharged with Keflex to cover her for urinary tract infection.  She states that her pain is well-managed at this time.  I recommended Tylenol and ibuprofen alternating at home.  Patient requested a work note and work note was given.  FINAL CLINICAL IMPRESSION(S) / ED DIAGNOSES   Final diagnoses:  Suprapubic pain     Rx / DC Orders   ED Discharge Orders          Ordered    cephALEXin (KEFLEX) 500 MG capsule  4 times daily        09/14/22 2025             Note:  This document was prepared using Dragon voice recognition software and may include unintentional dictation errors.   Pia Mau [place], Cordelia Poche 09/14/22 2026    [name], MD 09/15/22 1739

## 2022-09-14 NOTE — Discharge Instructions (Signed)
Take keflex four times daily for the next seven days.

## 2022-09-14 NOTE — ED Triage Notes (Signed)
Pt comes with c/o abdominal pain and vaginal bleeding. Pt states she was on period and was stopped for 2 days. Pt states the bleeding started again. Pt state she was then having wore cramps and vomiting.

## 2022-09-15 ENCOUNTER — Other Ambulatory Visit: Payer: Self-pay

## 2022-09-18 DIAGNOSIS — N83201 Unspecified ovarian cyst, right side: Secondary | ICD-10-CM

## 2022-09-18 HISTORY — DX: Unspecified ovarian cyst, right side: N83.201

## 2022-09-22 DIAGNOSIS — N83201 Unspecified ovarian cyst, right side: Secondary | ICD-10-CM | POA: Diagnosis not present

## 2022-09-30 ENCOUNTER — Other Ambulatory Visit: Payer: Self-pay | Admitting: Obstetrics and Gynecology

## 2022-10-14 DIAGNOSIS — N83201 Unspecified ovarian cyst, right side: Secondary | ICD-10-CM | POA: Diagnosis not present
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
## 2022-10-20 ENCOUNTER — Other Ambulatory Visit: Payer: Self-pay

## 2022-10-20 DIAGNOSIS — R1012 Left upper quadrant pain: Secondary | ICD-10-CM | POA: Diagnosis not present

## 2022-10-20 MED ORDER — PANTOPRAZOLE SODIUM 40 MG PO TBEC
40.0000 mg | DELAYED_RELEASE_TABLET | Freq: Every day | ORAL | 1 refills | Status: AC
  Filled 2022-10-20: qty 30, 30d supply, fill #0
  Filled 2022-11-15: qty 30, 30d supply, fill #1

## 2022-10-21 ENCOUNTER — Encounter
Admission: RE | Admit: 2022-10-21 | Discharge: 2022-10-21 | Disposition: A | Discharge status: Home / Self Care | Payer: 59 | Source: Ambulatory Visit | Attending: Obstetrics and Gynecology | Admitting: Obstetrics and Gynecology

## 2022-10-21 VITALS — Ht 65.0 in | Wt 253.0 lb

## 2022-10-21 DIAGNOSIS — Z01812 Encounter for preprocedural laboratory examination: Secondary | ICD-10-CM

## 2022-10-21 HISTORY — DX: Umbilical hernia without obstruction or gangrene: K42.9

## 2022-10-21 HISTORY — DX: Polycystic ovarian syndrome: E28.2

## 2022-10-21 HISTORY — DX: Morbid (severe) obesity due to excess calories: E66.01

## 2022-10-21 NOTE — Patient Instructions (Addendum)
Your procedure is scheduled on: Wednesday, March 13 Report to the Registration Desk on the 1st floor of the [facility]. To find out your arrival time, please call [phone] between 1PM - 3PM on: Tuesday, March 12 If your arrival time is 6:00 am, do not arrive before that time as the [place] entrance doors do not open until 6:00 am.  REMEMBER: Instructions that are not followed completely may result in serious medical risk, up to and including death; or upon the discretion of your surgeon and anesthesiologist your surgery may need to be rescheduled.  Do not eat food after midnight the night before surgery.  No gum chewing or hard candies.  You may however, drink CLEAR liquids up to 2 hours before you are scheduled to arrive for your surgery. Do not drink anything within 2 hours of your scheduled arrival time.  Clear liquids include: - water  - apple juice without pulp - gatorade (not RED colors) - black coffee or tea (Do NOT add milk or creamers to the coffee or tea) Do NOT drink anything that is not on this list.  In addition, your doctor has ordered for you to drink the provided:  Ensure Pre-Surgery Clear Carbohydrate Drink  Drinking this carbohydrate drink up to two hours before surgery helps to reduce insulin resistance and improve patient outcomes. Please complete drinking 2 hours before scheduled arrival time.  One week prior to surgery: starting March 6 Stop Anti-inflammatories (NSAIDS) such as Advil, Aleve, Ibuprofen, Motrin, Naproxen, Naprosyn and Aspirin based products such as Excedrin, Goody's Powder, BC Powder. Stop ANY OVER THE COUNTER supplements until after surgery. You may however, continue to take Tylenol if needed for pain up until the day of surgery.  Continue taking all prescribed medications   TAKE ONLY THESE MEDICATIONS THE MORNING OF SURGERY WITH A SIP OF WATER:  Pantoprazole (Protonix) - (take one the night before and one on the morning of surgery -  helps to prevent nausea after surgery.) Hydrocodone if needed for pain  No Alcohol for 24 hours before or after surgery.  No Smoking including e-cigarettes for 24 hours before surgery.  No chewable tobacco products for at least 6 hours before surgery.  No nicotine patches on the day of surgery.  Do not use any "recreational" drugs for at least a week (preferably 2 weeks) before your surgery.  Please be advised that the combination of cocaine and anesthesia may have negative outcomes, up to and including death. If you test positive for cocaine, your surgery will be cancelled.  On the morning of surgery brush your teeth with toothpaste and water, you may rinse your mouth with mouthwash if you wish. Do not swallow any toothpaste or mouthwash.  Use CHG Soap as directed on instruction sheet.  Do not wear jewelry, make-up, hairpins, clips or nail polish.  Do not wear lotions, powders, or perfumes.   Do not shave body hair from the neck down 48 hours before surgery.  Contact lenses, hearing aids and dentures may not be worn into surgery.  Do not bring valuables to the hospital. [facility] is not responsible for any missing/lost belongings or valuables.   Notify your doctor if there is any change in your medical condition (cold, fever, infection).  Wear comfortable clothing (specific to your surgery type) to the hospital.  After surgery, you can help prevent lung complications by doing breathing exercises.  Take deep breaths and cough every 1-2 hours. Your doctor may order a device called  an Chiropodist to help you take deep breaths. When coughing or sneezing, hold a pillow firmly against your incision with both hands. This is called "splinting." Doing this helps protect your incision. It also decreases belly discomfort.  If you are being discharged the day of surgery, you will not be allowed to drive home. You will need a responsible individual to drive you home and stay with  you for 24 hours after surgery.   If you are taking public transportation, you will need to have a responsible individual with you.  Please call the [place] [facility] at [phone] if you have any questions about these instructions.  Surgery Visitation Policy:  Patients undergoing a surgery or procedure may have two family members or support persons with them as long as the person is not COVID-19 positive or experiencing its symptoms.      Preparing for Surgery with CHLORHEXIDINE GLUCONATE (CHG) Soap  Chlorhexidine Gluconate (CHG) Soap  o An antiseptic cleaner that kills germs and bonds with the skin to continue killing germs even after washing  o Used for showering the night before surgery and morning of surgery  Before surgery, you can play an important role by reducing the number of germs on your skin.  CHG (Chlorhexidine gluconate) soap is an antiseptic cleanser which kills germs and bonds with the skin to continue killing germs even after washing.  Please do not use if you have an allergy to CHG or antibacterial soaps. If your skin becomes reddened/irritated stop using the CHG.  1. Shower the NIGHT BEFORE SURGERY and the MORNING OF SURGERY with CHG soap.  2. If you choose to wash your hair, wash your hair first as usual with your normal shampoo.  3. After shampooing, rinse your hair and body thoroughly to remove the shampoo.  4. Use CHG as you would any other liquid soap. You can apply CHG directly to the skin and wash gently with a scrungie or a clean washcloth.  5. Apply the CHG soap to your body only from the neck down. Do not use on open wounds or open sores. Avoid contact with your eyes, ears, mouth, and genitals (private parts). Wash face and genitals (private parts) with your normal soap.  6. Wash thoroughly, paying special attention to the area where your surgery will be performed.  7. Thoroughly rinse your body with warm water.  8. Do not  shower/wash with your normal soap after using and rinsing off the CHG soap.  9. Pat yourself dry with a clean towel.  10. Wear clean pajamas to bed the night before surgery.  12. Place clean sheets on your bed the night of your first shower and do not sleep with pets.  13. Shower again with the CHG soap on the day of surgery prior to arriving at the hospital.  14. Do not apply any deodorants/lotions/powders.  15. Please wear clean clothes to the hospital.

## 2022-10-22 ENCOUNTER — Inpatient Hospital Stay: Admission: RE | Admit: 2022-10-22 | Payer: 59 | Source: Ambulatory Visit

## 2022-10-24 ENCOUNTER — Encounter
Admission: RE | Admit: 2022-10-24 | Discharge: 2022-10-24 | Disposition: A | Discharge status: Home / Self Care | Payer: 59 | Source: Ambulatory Visit | Attending: Obstetrics and Gynecology | Admitting: Obstetrics and Gynecology

## 2022-10-24 DIAGNOSIS — Z01812 Encounter for preprocedural laboratory examination: Secondary | ICD-10-CM | POA: Diagnosis not present

## 2022-10-24 LAB — TYPE AND SCREEN
ABO/RH(D): O POS
Antibody Screen: NEGATIVE

## 2022-10-28 MED ORDER — CHLORHEXIDINE GLUCONATE 0.12 % MT SOLN
15.0000 mL | Freq: Once | OROMUCOSAL | Status: AC
  Administered 2022-10-29: 15 mL via OROMUCOSAL

## 2022-10-28 MED ORDER — LACTATED RINGERS IV SOLN: INTRAVENOUS | Status: DC

## 2022-10-28 MED ORDER — ORAL CARE MOUTH RINSE: 15.0000 mL | Freq: Once | OROMUCOSAL | Status: AC

## 2022-10-29 ENCOUNTER — Ambulatory Visit: Payer: 59 | Admitting: Certified Registered"

## 2022-10-29 ENCOUNTER — Encounter
Admission: RE | Disposition: A | Discharge status: Home / Self Care | Payer: Self-pay | Source: Ambulatory Visit | Attending: Obstetrics and Gynecology

## 2022-10-29 ENCOUNTER — Other Ambulatory Visit: Payer: Self-pay

## 2022-10-29 ENCOUNTER — Ambulatory Visit
Admission: RE | Admit: 2022-10-29 | Discharge: 2022-10-29 | Disposition: A | Discharge status: Home / Self Care | Payer: 59 | Source: Ambulatory Visit | Attending: Obstetrics and Gynecology | Admitting: Obstetrics and Gynecology

## 2022-10-29 DIAGNOSIS — Z6841 Body Mass Index (BMI) 40.0 and over, adult: Secondary | ICD-10-CM | POA: Insufficient documentation

## 2022-10-29 DIAGNOSIS — Z01812 Encounter for preprocedural laboratory examination: Secondary | ICD-10-CM

## 2022-10-29 DIAGNOSIS — D270 Benign neoplasm of right ovary: Secondary | ICD-10-CM | POA: Diagnosis present

## 2022-10-29 DIAGNOSIS — N8311 Corpus luteum cyst of right ovary: Secondary | ICD-10-CM | POA: Insufficient documentation

## 2022-10-29 DIAGNOSIS — E282 Polycystic ovarian syndrome: Secondary | ICD-10-CM | POA: Insufficient documentation

## 2022-10-29 DIAGNOSIS — D271 Benign neoplasm of left ovary: Secondary | ICD-10-CM | POA: Diagnosis not present

## 2022-10-29 DIAGNOSIS — N83202 Unspecified ovarian cyst, left side: Secondary | ICD-10-CM | POA: Clinically undetermined

## 2022-10-29 DIAGNOSIS — N83209 Unspecified ovarian cyst, unspecified side: Secondary | ICD-10-CM | POA: Diagnosis not present

## 2022-10-29 DIAGNOSIS — N83201 Unspecified ovarian cyst, right side: Secondary | ICD-10-CM | POA: Diagnosis not present

## 2022-10-29 HISTORY — PX: ROBOTIC ASSISTED LAPAROSCOPIC OVARIAN CYSTECTOMY: SHX6081

## 2022-10-29 LAB — ABO/RH: ABO/RH(D): O POS

## 2022-10-29 SURGERY — EXCISION, CYST, OVARY, ROBOT-ASSISTED, LAPAROSCOPIC
Anesthesia: General | Laterality: Bilateral

## 2022-10-29 MED ORDER — MIDAZOLAM HCL 2 MG/2ML IJ SOLN
INTRAMUSCULAR | Status: AC
  Filled 2022-10-29: qty 2

## 2022-10-29 MED ORDER — MIDAZOLAM HCL 2 MG/2ML IJ SOLN
INTRAMUSCULAR | Status: DC | PRN
  Administered 2022-10-29: 2 mg via INTRAVENOUS

## 2022-10-29 MED ORDER — DEXAMETHASONE SODIUM PHOSPHATE 10 MG/ML IJ SOLN
INTRAMUSCULAR | Status: DC | PRN
  Administered 2022-10-29: 10 mg via INTRAVENOUS

## 2022-10-29 MED ORDER — CHLORHEXIDINE GLUCONATE 0.12 % MT SOLN
OROMUCOSAL | Status: AC
  Filled 2022-10-29: qty 15

## 2022-10-29 MED ORDER — LIDOCAINE HCL (PF) 2 % IJ SOLN
INTRAMUSCULAR | Status: AC
  Filled 2022-10-29: qty 5

## 2022-10-29 MED ORDER — KETAMINE HCL 10 MG/ML IJ SOLN
INTRAMUSCULAR | Status: DC | PRN
  Administered 2022-10-29 (×2): 10 mg via INTRAVENOUS
  Administered 2022-10-29: 30 mg via INTRAVENOUS

## 2022-10-29 MED ORDER — ROCURONIUM BROMIDE 100 MG/10ML IV SOLN
INTRAVENOUS | Status: DC | PRN
  Administered 2022-10-29: 70 mg via INTRAVENOUS

## 2022-10-29 MED ORDER — ACETAMINOPHEN 10 MG/ML IV SOLN
INTRAVENOUS | Status: AC
  Filled 2022-10-29: qty 100

## 2022-10-29 MED ORDER — IBUPROFEN 600 MG PO TABS
600.0000 mg | ORAL_TABLET | Freq: Four times a day (QID) | ORAL | 0 refills | Status: AC
  Filled 2022-10-29: qty 30, 8d supply, fill #0

## 2022-10-29 MED ORDER — OXYCODONE HCL 5 MG PO TABS
5.0000 mg | ORAL_TABLET | Freq: Once | ORAL | Status: AC | PRN
  Administered 2022-10-29: 5 mg via ORAL

## 2022-10-29 MED ORDER — HYDROMORPHONE HCL 1 MG/ML IJ SOLN
0.2500 mg | INTRAMUSCULAR | Status: DC | PRN
  Administered 2022-10-29: 0.5 mg via INTRAVENOUS

## 2022-10-29 MED ORDER — PHENYLEPHRINE 80 MCG/ML (10ML) SYRINGE FOR IV PUSH (FOR BLOOD PRESSURE SUPPORT)
PREFILLED_SYRINGE | INTRAVENOUS | Status: AC
  Filled 2022-10-29: qty 10

## 2022-10-29 MED ORDER — BUPIVACAINE HCL (PF) 0.5 % IJ SOLN
INTRAMUSCULAR | Status: AC
  Filled 2022-10-29: qty 30

## 2022-10-29 MED ORDER — HEMOSTATIC AGENTS (NO CHARGE) OPTIME
TOPICAL | Status: DC | PRN
  Administered 2022-10-29: 1 via TOPICAL

## 2022-10-29 MED ORDER — ACETAMINOPHEN 10 MG/ML IV SOLN
INTRAVENOUS | Status: DC | PRN
  Administered 2022-10-29: 1000 mg via INTRAVENOUS

## 2022-10-29 MED ORDER — DEXMEDETOMIDINE HCL IN NACL 80 MCG/20ML IV SOLN
INTRAVENOUS | Status: DC | PRN
  Administered 2022-10-29 (×2): 8 ug via BUCCAL

## 2022-10-29 MED ORDER — KETOROLAC TROMETHAMINE 30 MG/ML IJ SOLN
INTRAMUSCULAR | Status: AC
  Filled 2022-10-29: qty 1

## 2022-10-29 MED ORDER — SUGAMMADEX SODIUM 500 MG/5ML IV SOLN
INTRAVENOUS | Status: DC | PRN
  Administered 2022-10-29: 230 mg via INTRAVENOUS

## 2022-10-29 MED ORDER — SODIUM CHLORIDE 0.9 % IR SOLN
Status: DC | PRN
  Administered 2022-10-29: 1000 mL

## 2022-10-29 MED ORDER — GLYCOPYRROLATE 0.2 MG/ML IJ SOLN
INTRAMUSCULAR | Status: AC
  Filled 2022-10-29: qty 1

## 2022-10-29 MED ORDER — 0.9 % SODIUM CHLORIDE (POUR BTL) OPTIME
TOPICAL | Status: DC | PRN
  Administered 2022-10-29: 500 mL

## 2022-10-29 MED ORDER — OXYCODONE-ACETAMINOPHEN 5-325 MG PO TABS
1.0000 | ORAL_TABLET | Freq: Four times a day (QID) | ORAL | 0 refills | Status: AC | PRN
  Filled 2022-10-29: qty 20, 5d supply, fill #0

## 2022-10-29 MED ORDER — GLYCOPYRROLATE 0.2 MG/ML IJ SOLN
INTRAMUSCULAR | Status: DC | PRN
  Administered 2022-10-29: .1 mg via INTRAVENOUS

## 2022-10-29 MED ORDER — ONDANSETRON HCL 4 MG/2ML IJ SOLN
INTRAMUSCULAR | Status: DC | PRN
  Administered 2022-10-29: 4 mg via INTRAVENOUS

## 2022-10-29 MED ORDER — ROCURONIUM BROMIDE 10 MG/ML (PF) SYRINGE
PREFILLED_SYRINGE | INTRAVENOUS | Status: AC
  Filled 2022-10-29: qty 10

## 2022-10-29 MED ORDER — KETAMINE HCL 50 MG/5ML IJ SOSY
PREFILLED_SYRINGE | INTRAMUSCULAR | Status: AC
  Filled 2022-10-29: qty 5

## 2022-10-29 MED ORDER — PHENYLEPHRINE HCL (PRESSORS) 10 MG/ML IV SOLN
INTRAVENOUS | Status: DC | PRN
  Administered 2022-10-29 (×2): 80 ug via INTRAVENOUS

## 2022-10-29 MED ORDER — FENTANYL CITRATE (PF) 100 MCG/2ML IJ SOLN
INTRAMUSCULAR | Status: DC | PRN
  Administered 2022-10-29: 100 ug via INTRAVENOUS

## 2022-10-29 MED ORDER — OXYCODONE HCL 5 MG/5ML PO SOLN: 5.0000 mg | Freq: Once | ORAL | Status: AC | PRN

## 2022-10-29 MED ORDER — OXYCODONE HCL 5 MG PO TABS
ORAL_TABLET | ORAL | Status: AC
  Filled 2022-10-29: qty 1

## 2022-10-29 MED ORDER — ONDANSETRON HCL 4 MG/2ML IJ SOLN
INTRAMUSCULAR | Status: AC
  Filled 2022-10-29: qty 2

## 2022-10-29 MED ORDER — HYDROMORPHONE HCL 1 MG/ML IJ SOLN
INTRAMUSCULAR | Status: AC
  Administered 2022-10-29: 0.5 mg via INTRAVENOUS
  Filled 2022-10-29: qty 1

## 2022-10-29 MED ORDER — PROPOFOL 10 MG/ML IV BOLUS
INTRAVENOUS | Status: DC | PRN
  Administered 2022-10-29: 200 mg via INTRAVENOUS

## 2022-10-29 MED ORDER — KETOROLAC TROMETHAMINE 30 MG/ML IJ SOLN
INTRAMUSCULAR | Status: DC | PRN
  Administered 2022-10-29: 30 mg via INTRAVENOUS

## 2022-10-29 MED ORDER — LIDOCAINE HCL (CARDIAC) PF 100 MG/5ML IV SOSY
PREFILLED_SYRINGE | INTRAVENOUS | Status: DC | PRN
  Administered 2022-10-29: 100 mg via INTRAVENOUS

## 2022-10-29 MED ORDER — FENTANYL CITRATE (PF) 100 MCG/2ML IJ SOLN
INTRAMUSCULAR | Status: AC
  Filled 2022-10-29: qty 2

## 2022-10-29 MED ORDER — PROPOFOL 10 MG/ML IV BOLUS
INTRAVENOUS | Status: AC
  Filled 2022-10-29: qty 20

## 2022-10-29 MED ORDER — ONDANSETRON 4 MG PO TBDP
4.0000 mg | ORAL_TABLET | Freq: Four times a day (QID) | ORAL | 0 refills | Status: AC | PRN
  Filled 2022-10-29: qty 20, 5d supply, fill #0

## 2022-10-29 MED ORDER — DEXAMETHASONE SODIUM PHOSPHATE 10 MG/ML IJ SOLN
INTRAMUSCULAR | Status: AC
  Filled 2022-10-29: qty 1

## 2022-10-29 MED ORDER — BUPIVACAINE HCL 0.5 % IJ SOLN
INTRAMUSCULAR | Status: DC | PRN
  Administered 2022-10-29: 12 mL

## 2022-10-29 SURGICAL SUPPLY — 78 items
ADH SKN CLS APL DERMABOND .7 (GAUZE/BANDAGES/DRESSINGS) ×1
BAG DRN RND TRDRP ANRFLXCHMBR (UROLOGICAL SUPPLIES) ×1
BAG URINE DRAIN 2000ML AR STRL (UROLOGICAL SUPPLIES) ×1 IMPLANT
CANNULA REDUC XI 12-8 STAPL (CANNULA) ×1
CANNULA REDUCER 12-8 DVNC XI (CANNULA) ×1 IMPLANT
CATH FOLEY 2WAY  5CC 16FR (CATHETERS) ×1
CATH FOLEY 2WAY 5CC 16FR (CATHETERS) ×1
CATH URTH 16FR FL 2W BLN LF (CATHETERS) ×1 IMPLANT
COVER TIP SHEARS 8 DVNC (MISCELLANEOUS) ×1 IMPLANT
COVER TIP SHEARS 8MM DA VINCI (MISCELLANEOUS) ×1
COVER WAND RF STERILE (DRAPES) ×1 IMPLANT
DERMABOND ADVANCED .7 DNX12 (GAUZE/BANDAGES/DRESSINGS) ×1 IMPLANT
DRAPE 3/4 80X56 (DRAPES) IMPLANT
DRAPE ARM DVNC X/XI (DISPOSABLE) ×3 IMPLANT
DRAPE COLUMN DVNC XI (DISPOSABLE) ×1 IMPLANT
DRAPE DA VINCI XI ARM (DISPOSABLE) ×4
DRAPE DA VINCI XI COLUMN (DISPOSABLE) ×1
DRAPE ROBOT W/ LEGGING 30X125 (DRAPES) ×1 IMPLANT
DRAPE UNDER BUTTOCK W/FLU (DRAPES) ×1 IMPLANT
DRESSING SURGICEL FIBRLLR 1X2 (HEMOSTASIS) IMPLANT
DRSG SURGICEL FIBRILLAR 1X2 (HEMOSTASIS) ×2
ELECT REM PT RETURN 9FT ADLT (ELECTROSURGICAL) ×1
ELECTRODE REM PT RTRN 9FT ADLT (ELECTROSURGICAL) ×1 IMPLANT
GAUZE 4X4 16PLY ~~LOC~~+RFID DBL (SPONGE) IMPLANT
GLOVE SURG SYN 7.0 (GLOVE) ×3 IMPLANT
GLOVE SURG SYN 7.0 PF PI (GLOVE) ×3 IMPLANT
GLOVE SURG UNDER POLY LF SZ7.5 (GLOVE) ×3 IMPLANT
GOWN STRL REUS W/ TWL LRG LVL3 (GOWN DISPOSABLE) ×4 IMPLANT
GOWN STRL REUS W/TWL LRG LVL3 (GOWN DISPOSABLE) ×4
GRASPER SUT TROCAR 14GX15 (MISCELLANEOUS) IMPLANT
IRRIGATION STRYKERFLOW (MISCELLANEOUS) IMPLANT
IRRIGATOR STRYKERFLOW (MISCELLANEOUS)
IRRIGATOR SUCT 8 DISP DVNC XI (IRRIGATION / IRRIGATOR) IMPLANT
IRRIGATOR SUCTION 8MM XI DISP (IRRIGATION / IRRIGATOR) ×1
IV NS 1000ML (IV SOLUTION) ×1
IV NS 1000ML BAXH (IV SOLUTION) IMPLANT
KIT PINK PAD W/HEAD ARE REST (MISCELLANEOUS) ×1
KIT PINK PAD W/HEAD ARM REST (MISCELLANEOUS) ×1 IMPLANT
LABEL OR SOLS (LABEL) ×1 IMPLANT
MANIFOLD NEPTUNE II (INSTRUMENTS) ×1 IMPLANT
MANIPULATOR UTERINE 4.5 ZUMI (MISCELLANEOUS) IMPLANT
MANIPULATOR VCARE LG CRV RETR (MISCELLANEOUS) IMPLANT
MANIPULATOR VCARE SML CRV RETR (MISCELLANEOUS) IMPLANT
MANIPULATOR VCARE STD CRV RETR (MISCELLANEOUS) IMPLANT
NEEDLE HYPO 22GX1.5 SAFETY (NEEDLE) ×1 IMPLANT
NS IRRIG 1000ML POUR BTL (IV SOLUTION) ×2 IMPLANT
OBTURATOR OPTICAL STANDARD 8MM (TROCAR) ×1
OBTURATOR OPTICAL STND 8 DVNC (TROCAR) ×1
OBTURATOR OPTICALSTD 8 DVNC (TROCAR) ×1 IMPLANT
OCCLUDER COLPOPNEUMO (BALLOONS) ×1 IMPLANT
PACK LAP CHOLECYSTECTOMY (MISCELLANEOUS) ×1 IMPLANT
PAD OB MATERNITY 4.3X12.25 (PERSONAL CARE ITEMS) ×1 IMPLANT
PAD PREP 24X41 OB/GYN DISP (PERSONAL CARE ITEMS) ×1 IMPLANT
SCRUB CHG 4% DYNA-HEX 4OZ (MISCELLANEOUS) ×1 IMPLANT
SEAL CANN UNIV 5-8 DVNC XI (MISCELLANEOUS) ×3 IMPLANT
SEAL XI 5MM-8MM UNIVERSAL (MISCELLANEOUS) ×3
SEALER VESSEL DA VINCI XI (MISCELLANEOUS)
SEALER VESSEL EXT DVNC XI (MISCELLANEOUS) IMPLANT
SOL ELECTROSURG ANTI STICK (MISCELLANEOUS) ×1
SOLUTION ELECTROSURG ANTI STCK (MISCELLANEOUS) ×1 IMPLANT
SPONGE T-LAP 18X18 ~~LOC~~+RFID (SPONGE) ×1 IMPLANT
STAPLER CANNULA SEAL DVNC XI (STAPLE) ×1 IMPLANT
STAPLER CANNULA SEAL XI (STAPLE)
SURGILUBE 2OZ TUBE FLIPTOP (MISCELLANEOUS) ×1 IMPLANT
SUT DVC VLOC 180 0 12IN GS21 (SUTURE)
SUT MNCRL 4-0 (SUTURE) ×2
SUT MNCRL 4-0 27XMFL (SUTURE) ×2
SUT VIC AB 0 CT2 27 (SUTURE) ×1 IMPLANT
SUT VIC AB 2-0 CT1 27 (SUTURE) ×1
SUT VIC AB 2-0 CT1 TAPERPNT 27 (SUTURE) ×1 IMPLANT
SUT VICRYL 0 UR6 27IN ABS (SUTURE) ×1 IMPLANT
SUTURE DVC VLC 180 0 12IN GS21 (SUTURE) ×2 IMPLANT
SUTURE MNCRL 4-0 27XMF (SUTURE) ×2 IMPLANT
SYR 10ML LL (SYRINGE) ×1 IMPLANT
SYR 50ML LL SCALE MARK (SYRINGE) ×1 IMPLANT
TRAP FLUID SMOKE EVACUATOR (MISCELLANEOUS) ×1 IMPLANT
TUBING EVAC SMOKE HEATED PNEUM (TUBING) ×1 IMPLANT
WATER STERILE IRR 500ML POUR (IV SOLUTION) ×1 IMPLANT

## 2022-10-29 NOTE — Anesthesia Preprocedure Evaluation (Signed)
Anesthesia Evaluation  Patient identified by MRN, date of birth, ID band Patient awake    Reviewed: Allergy & Precautions, NPO status , Patient's Chart, lab work & pertinent test results  History of Anesthesia Complications Negative for: history of anesthetic complications  Airway Mallampati: II  TM Distance: >3 FB Neck ROM: full    Dental no notable dental hx.    Pulmonary neg pulmonary ROS   Pulmonary exam normal        Cardiovascular negative cardio ROS Normal cardiovascular exam     Neuro/Psych negative neurological ROS  negative psych ROS   GI/Hepatic negative GI ROS, Neg liver ROS,,,  Endo/Other    Morbid obesity  Renal/GU      Musculoskeletal   Abdominal   Peds  Hematology negative hematology ROS (+)   Anesthesia Other Findings Past Medical History: No date: Morbid obesity with BMI of 40.0-44.9, adult (HCC) No date: PCOS (polycystic ovarian syndrome) No date: Pre-diabetes 09/2022: Right ovarian cyst No date: Umbilical hernia  Past Surgical History: [id number]: UMBILICAL HERNIA REPAIR  BMI    Body Mass Index: 42.10 kg/m      Reproductive/Obstetrics negative OB ROS                             Anesthesia Physical Anesthesia Plan  ASA: 3  Anesthesia Plan: General ETT   Post-op Pain Management: Toradol IV (intra-op)*, Ofirmev IV (intra-op)*, Dilaudid IV and Ketamine IV*   Induction: Intravenous  PONV Risk Score and Plan: 3 and Ondansetron, Dexamethasone, Midazolam and Treatment may vary due to age or medical condition  Airway Management Planned: Oral ETT  Additional Equipment:   Intra-op Plan:   Post-operative Plan: Extubation in OR  Informed Consent: I have reviewed the patients History and Physical, chart, labs and discussed the procedure including the risks, benefits and alternatives for the proposed anesthesia with the patient or authorized representative who has  indicated his/her understanding and acceptance.     Dental Advisory Given  Plan Discussed with: Anesthesiologist, CRNA and Surgeon  Anesthesia Plan Comments: (Patient consented for risks of anesthesia including but not limited to:  - adverse reactions to medications - damage to eyes, teeth, lips or other oral mucosa - nerve damage due to positioning  - sore throat or hoarseness - Damage to heart, brain, nerves, lungs, other parts of body or loss of life  Patient voiced understanding.)       Anesthesia Quick Evaluation

## 2022-10-29 NOTE — Interval H&P Note (Signed)
History and Physical Interval Note:  [id number] AM  Brianna Gonzalez  has presented today for surgery, with the diagnosis of right ovarian cyst.  The various methods of treatment have been discussed with the patient and family. After consideration of risks, benefits and other options for treatment, the patient has consented to  Procedure(s): XI ROBOTIC ASSISTED LAPAROSCOPIC OVARIAN CYSTECTOMY, REMOVAL OF ABNORMAL TISSUE (Right) as a surgical intervention.  The patient's history has been reviewed, patient examined, no change in status, stable for surgery.  I have reviewed the patient's chart and labs.  Questions were answered to the patient's satisfaction.    Prentice Docker, MD, [facility] 10/29/2022 7:14 AM

## 2022-10-29 NOTE — Anesthesia Procedure Notes (Signed)
Procedure Name: Intubation Date/Time: 10/29/2022 7:40 AM  Performed by: Cammie Sickle, CRNAPre-anesthesia Checklist: Patient identified, Patient being monitored, Timeout performed, Emergency Drugs available and Suction available Patient Re-evaluated:Patient Re-evaluated prior to induction Oxygen Delivery Method: Circle system utilized Preoxygenation: Pre-oxygenation with 100% oxygen Induction Type: IV induction Ventilation: Mask ventilation without difficulty and Oral airway inserted - appropriate to patient size Laryngoscope Size: 3 and McGraph Grade View: Grade I Tube type: Oral Tube size: 7.0 mm Number of attempts: 1 Airway Equipment and Method: Stylet Placement Confirmation: ETT inserted through vocal cords under direct vision, positive ETCO2 and breath sounds checked- equal and bilateral Secured at: 22 cm Tube secured with: Tape Dental Injury: Teeth and Oropharynx as per pre-operative assessment  Comments: Atraumatic smooth intubation, no complications noted.

## 2022-10-29 NOTE — Discharge Instructions (Signed)

## 2022-10-29 NOTE — Anesthesia Postprocedure Evaluation (Signed)
Anesthesia Post Note  Patient: Brianna Gonzalez  Procedure(s) Performed: XI ROBOTIC ASSISTED LAPAROSCOPIC OVARIAN CYSTECTOMY, REMOVAL OF ABNORMAL TISSUE (Bilateral)  Patient location during evaluation: PACU Anesthesia Type: General Level of consciousness: awake and alert Pain management: pain level controlled Vital Signs Assessment: post-procedure vital signs reviewed and stable Respiratory status: spontaneous breathing, nonlabored ventilation, respiratory function stable and patient connected to nasal cannula oxygen Cardiovascular status: blood pressure returned to baseline and stable Postop Assessment: no apparent nausea or vomiting Anesthetic complications: no   No notable events documented.   Last Vitals:  Vitals:   10/29/22 1043 10/29/22 1045  BP:  118/72  Pulse: 99 90  Resp: (!) 21 17  Temp:    SpO2: 98% 100%    Last Pain:  Vitals:   10/29/22 0626  TempSrc: Oral                 Ilene Qua

## 2022-10-29 NOTE — Op Note (Signed)
Operative Note    Name: Brianna Gonzalez  Date of Service: 10/29/2022  DOB: 2001/08/18  MRN: [id number]   Pre-Operative Diagnosis:  1) Right ovarian dermoid cyst 2) Left ovarian cyst  Post-Operative Diagnosis:  1) Right ovarian dermoid cyst 2) Left ovarian cyst  Procedures:  Robot assisted laparoscopic bilateral ovarian cystectomy  Primary Surgeon: Prentice Docker, MD   EBL: 50 mL   IVF: 800 mL   Urine output: 750 mL  Specimens:  1) Left ovarian cyst wall 2) Right ovarian cyst wall  Drains: none  Complications: None   Disposition: PACU   Condition: Stable   Findings:  1) Left ovarian cyst with bloody contents, likely consistent with hemorrhagic cyst 2) Right ovarian cyst with sebaceous contents, consistent with dermoid cyst 3) no other abnormal findings in the pelvis, with a normal uterus, cervix, bilateral fallopian tubes. 4) no evidence of endometriosis was obvious.  Procedure Summary:  The patient was taken to the operating room where general anesthesia was administered and found to be adequate. She was placed in the dorsal supine lithotomy position in [place] stirrups and prepped and draped in usual sterile fashion. After a timeout was called an indwelling catheter was placed in her bladder.  A sterile speculum was placed in her vagina.  A single-tooth tenaculum was used to grasp the anterior lip of her cervix.  A ZUMI uterine manipulator was attached in accordance to the manufacturer's recommendations.  The tenaculum was removed and the speculum was removed.  Attention was turned to the abdomen where after injection of local anesthetic, an 12 mm supraumbilical incision was made with the scalpel. Entry into the abdomen was obtained via Optiview trocar technique (a blunt entry technique with camera visualization through the obturator upon entry). This was done with an 8 mm trocar. Verification of entry into the abdomen was obtained using opening pressures. The  abdomen was insufflated with CO2. The camera was introduced through the trocar with verification of atraumatic entry.  Right and left abdominal entry sites were created after injection of local anesthetic about 8 cm away from the umbilical port in accordance with the Intuitive manufacturer's recommendations.  The port sites were 8 mm.  The intuitive trochars were introduced under intra-abdominal camera visualization without difficulty.  An additional trocar site was created 8 cm lateral to the left most trocar site with at least 2 cm of clearance above the left anterior superior iliac spine.  The camera was placed in the right lateral port.  The supraumbilical 8 mm trocar was replaced for a 12 mm trocar.  A specimen retrieval bag was introduced completely into the pelvis through the 12 mm trocar.  The XI robot was docked on the patient's left.  Clearance was verified from the patient's legs.  Through the umbilical port the camera was placed.  Through the port attached to arm 2 the monopolar scissors were placed.  Through the port attached to arm 4 the [place] bipolar forceps were was placed.  The forced bipolar forceps was attached to port 1.    Attention was turned to the pelvis.  The specimen retrieval bag was opened under the left ovarian cyst.  Using the scissors to score the capsule of the left ovary there was immediate return of red fluid into the specimen retrieval bag.  The opening in the cyst wall was further developed using the scissors.  The cyst wall was then completely removed from the ovary using the [place] forceps and forced bipolar forceps.  Hemostasis  was obtained using bipolar electrocautery.  The specimen retrieval bag was emptied using suction and then placed under the right ovarian cyst.  The right ovarian cyst capsule was similarly scored using the monopolar scissors.  The cyst was attempted to be dissected out.  However return of copious sebaceous material was expelled into the specimen  retrieval bag.  This was expressed until no more fluid returned.  The cyst was further developed and separated from the ovarian stroma until it was completely removed.  This was placed in the specimen retrieval bag.  The specimen retrieval bag was suction for all fluidlike material.  Hemostasis on the ovarian stroma was obtained using bipolar electrocautery.  Both ovaries were packed with fibrillar are to assure ongoing hemostasis.  The specimen retrieval bag was cinched closed.  The exterior surface of the bag was copiously rinsed and the fluid was suctioned.  The bag was then placed laterally above the pelvic brim.  The pelvis was then copiously irrigated to ensure that any fluid from the dermoid cyst that escaped the bag would be copiously irrigated and removed.  I used 2 L of normal saline to accomplish this.  After assuring hemostasis, the camera was moved to port 4.  The string of the specimen bag was passed to a grasper through the supraumbilical port.  At this point all instruments were removed from the robot.  The robot was undocked and moved from the patient bedside.  The specimen bag was brought to the incision site and removed intact.  The supraumbilical incision fascia was closed under direct intra-abdominal camera visualization using the PMI device and a figure-of-eight stitch of 0 Vicryl.  The abdomen was emptied of CO2 with the aid of 5 deep breaths from anesthesia.  All trocars were removed.  The skin incisions were closed using 4-0 Monocryl in a subcuticular fashion and reinforced using surgical skin glue.  Attention was turned to the pelvis and the catheter was removed.  The sterile speculum was replaced in the vagina and the ZUMI manipulator was removed.  Silver nitrate was applied to the tenaculum entry sites to ensure ongoing hemostasis.  There was no bleeding from the cervix at this point.  After ensuring no instruments or sponges were left in the vagina, the speculum was removed.  The  patient tolerated the procedure well.  Sponge, lap, needle, and instrument counts were correct x 2.  VTE prophylaxis: SCDs. Antibiotic prophylaxis: none indicated and none given. She was awakened in the operating room and was taken to the PACU in stable condition.   Prentice Docker, MD 10/29/2022 10:16 AM

## 2022-10-29 NOTE — Transfer of Care (Signed)
Immediate Anesthesia Transfer of Care Note  Patient: Brianna Gonzalez  Procedure(s) Performed: XI ROBOTIC ASSISTED LAPAROSCOPIC OVARIAN CYSTECTOMY, REMOVAL OF ABNORMAL TISSUE (Bilateral)  Patient Location: PACU  Anesthesia Type:General  Level of Consciousness: drowsy  Airway & Oxygen Therapy: Patient Spontanous Breathing and Patient connected to face mask oxygen  Post-op Assessment: Report given to RN and Post -op Vital signs reviewed and stable  Post vital signs: Reviewed and stable  Last Vitals:  Vitals Value Taken Time  BP 104/60 10/29/22 1030  Temp 97.8 1030  Pulse 88 10/29/22 1035  Resp 12 10/29/22 1035  SpO2 98 % 10/29/22 1035  Vitals shown include unvalidated device data.  Last Pain:  Vitals:   10/29/22 0626  TempSrc: Oral         Complications: No notable events documented.

## 2022-10-30 ENCOUNTER — Encounter: Payer: Self-pay | Admitting: Obstetrics and Gynecology

## 2022-10-30 LAB — SURGICAL PATHOLOGY

## 2022-12-05 ENCOUNTER — Other Ambulatory Visit: Payer: Self-pay

## 2023-02-20 DIAGNOSIS — R399 Unspecified symptoms and signs involving the genitourinary system: Secondary | ICD-10-CM | POA: Diagnosis not present

## 2023-08-05 ENCOUNTER — Other Ambulatory Visit: Payer: Self-pay

## 2023-08-05 DIAGNOSIS — Z113 Encounter for screening for infections with a predominantly sexual mode of transmission: Secondary | ICD-10-CM | POA: Diagnosis not present

## 2023-08-05 DIAGNOSIS — A5901 Trichomonal vulvovaginitis: Secondary | ICD-10-CM | POA: Diagnosis not present

## 2023-08-05 DIAGNOSIS — Z202 Contact with and (suspected) exposure to infections with a predominantly sexual mode of transmission: Secondary | ICD-10-CM | POA: Diagnosis not present

## 2023-08-05 MED ORDER — METRONIDAZOLE 500 MG PO TABS
500.0000 mg | ORAL_TABLET | Freq: Two times a day (BID) | ORAL | 0 refills | Status: AC
  Filled 2023-08-05: qty 14, 7d supply, fill #0
